# Patient Record
Sex: Female | Born: 1960 | Race: Black or African American | Hispanic: No | Marital: Married | Smoking: Former smoker
Health system: Southern US, Community
[De-identification: ages and names within clinical notes are randomized; demographics above are authoritative.]

## PROBLEM LIST (undated history)

## (undated) DIAGNOSIS — I10 Essential (primary) hypertension: Secondary | ICD-10-CM

## (undated) DIAGNOSIS — E119 Type 2 diabetes mellitus without complications: Secondary | ICD-10-CM

## (undated) HISTORY — PX: BREAST SURGERY: SHX581

## (undated) HISTORY — PX: WISDOM TOOTH EXTRACTION: SHX21

---

## 2004-10-12 ENCOUNTER — Other Ambulatory Visit: Payer: Self-pay

## 2004-10-12 ENCOUNTER — Emergency Department: Payer: Self-pay | Admitting: Emergency Medicine

## 2004-10-17 ENCOUNTER — Emergency Department: Payer: Self-pay | Admitting: Emergency Medicine

## 2004-10-19 ENCOUNTER — Emergency Department: Payer: Self-pay | Admitting: Emergency Medicine

## 2004-10-22 HISTORY — PX: BREAST EXCISIONAL BIOPSY: SUR124

## 2004-10-26 ENCOUNTER — Emergency Department: Payer: Self-pay | Admitting: Unknown Physician Specialty

## 2006-08-30 ENCOUNTER — Ambulatory Visit: Payer: Self-pay | Admitting: Family Medicine

## 2008-12-14 ENCOUNTER — Ambulatory Visit: Payer: Self-pay | Admitting: Internal Medicine

## 2009-09-15 ENCOUNTER — Emergency Department: Payer: Self-pay | Admitting: Emergency Medicine

## 2010-01-12 ENCOUNTER — Ambulatory Visit: Payer: Self-pay | Admitting: Internal Medicine

## 2011-01-25 ENCOUNTER — Ambulatory Visit: Payer: Self-pay | Admitting: Internal Medicine

## 2012-02-13 ENCOUNTER — Ambulatory Visit: Payer: Self-pay | Admitting: Internal Medicine

## 2012-03-04 ENCOUNTER — Ambulatory Visit: Payer: Self-pay | Admitting: Internal Medicine

## 2013-02-25 ENCOUNTER — Ambulatory Visit: Payer: Self-pay | Admitting: Internal Medicine

## 2013-04-14 ENCOUNTER — Ambulatory Visit: Payer: Self-pay | Admitting: Internal Medicine

## 2014-02-16 ENCOUNTER — Ambulatory Visit: Payer: Self-pay | Admitting: Internal Medicine

## 2014-05-17 ENCOUNTER — Ambulatory Visit: Payer: Self-pay | Admitting: Internal Medicine

## 2015-02-05 ENCOUNTER — Emergency Department: Admit: 2015-02-05 | Disposition: A | Discharge status: Home / Self Care | Payer: Self-pay | Admitting: Internal Medicine

## 2015-02-22 MED ORDER — FENTANYL CITRATE (PF) 100 MCG/2ML IJ SOLN
INTRAMUSCULAR | Status: AC
  Filled 2015-02-22: qty 2

## 2015-02-23 ENCOUNTER — Encounter: Payer: Self-pay | Admitting: *Deleted

## 2015-02-23 ENCOUNTER — Other Ambulatory Visit: Payer: Self-pay

## 2015-02-23 DIAGNOSIS — G5601 Carpal tunnel syndrome, right upper limb: Secondary | ICD-10-CM | POA: Diagnosis present

## 2015-02-23 DIAGNOSIS — E119 Type 2 diabetes mellitus without complications: Secondary | ICD-10-CM | POA: Diagnosis not present

## 2015-02-23 DIAGNOSIS — Z8249 Family history of ischemic heart disease and other diseases of the circulatory system: Secondary | ICD-10-CM | POA: Diagnosis not present

## 2015-02-23 DIAGNOSIS — Z823 Family history of stroke: Secondary | ICD-10-CM | POA: Diagnosis not present

## 2015-02-23 DIAGNOSIS — Z833 Family history of diabetes mellitus: Secondary | ICD-10-CM | POA: Diagnosis not present

## 2015-02-23 DIAGNOSIS — Z79899 Other long term (current) drug therapy: Secondary | ICD-10-CM | POA: Diagnosis not present

## 2015-02-23 DIAGNOSIS — F17210 Nicotine dependence, cigarettes, uncomplicated: Secondary | ICD-10-CM | POA: Diagnosis not present

## 2015-02-23 DIAGNOSIS — E785 Hyperlipidemia, unspecified: Secondary | ICD-10-CM | POA: Diagnosis not present

## 2015-02-24 ENCOUNTER — Other Ambulatory Visit: Payer: Self-pay

## 2015-02-24 ENCOUNTER — Encounter
Admission: RE | Admit: 2015-02-24 | Discharge: 2015-02-24 | Disposition: A | Discharge status: Home / Self Care | Payer: BLUE CROSS/BLUE SHIELD | Source: Ambulatory Visit | Attending: Anesthesiology | Admitting: Anesthesiology

## 2015-02-24 DIAGNOSIS — I10 Essential (primary) hypertension: Secondary | ICD-10-CM | POA: Insufficient documentation

## 2015-02-24 DIAGNOSIS — R9431 Abnormal electrocardiogram [ECG] [EKG]: Secondary | ICD-10-CM | POA: Diagnosis not present

## 2015-02-24 LAB — BASIC METABOLIC PANEL
ANION GAP: 7 (ref 5–15)
BUN: 14 mg/dL (ref 6–20)
CALCIUM: 9.7 mg/dL (ref 8.9–10.3)
CO2: 32 mmol/L (ref 22–32)
Chloride: 103 mmol/L (ref 101–111)
Creatinine, Ser: 0.71 mg/dL (ref 0.44–1.00)
GFR calc non Af Amer: >60 mL/min (ref >60)
Glucose, Bld: 117 mg/dL — ABNORMAL HIGH (ref 65–99)
Potassium: 4.2 mmol/L (ref 3.5–5.1)
Sodium: 142 mmol/L (ref 135–145)

## 2015-02-28 ENCOUNTER — Encounter: Payer: Self-pay | Admitting: *Deleted

## 2015-02-28 ENCOUNTER — Ambulatory Visit: Payer: BLUE CROSS/BLUE SHIELD | Admitting: Anesthesiology

## 2015-02-28 ENCOUNTER — Encounter
Admission: RE | Disposition: A | Discharge status: Home / Self Care | Payer: Self-pay | Source: Ambulatory Visit | Attending: Orthopedic Surgery

## 2015-02-28 ENCOUNTER — Ambulatory Visit
Admission: RE | Admit: 2015-02-28 | Discharge: 2015-02-28 | Disposition: A | Discharge status: Home / Self Care | Payer: BLUE CROSS/BLUE SHIELD | Source: Ambulatory Visit | Attending: Orthopedic Surgery | Admitting: Orthopedic Surgery

## 2015-02-28 DIAGNOSIS — E785 Hyperlipidemia, unspecified: Secondary | ICD-10-CM | POA: Insufficient documentation

## 2015-02-28 DIAGNOSIS — E119 Type 2 diabetes mellitus without complications: Secondary | ICD-10-CM | POA: Insufficient documentation

## 2015-02-28 DIAGNOSIS — G5601 Carpal tunnel syndrome, right upper limb: Secondary | ICD-10-CM | POA: Diagnosis not present

## 2015-02-28 DIAGNOSIS — Z833 Family history of diabetes mellitus: Secondary | ICD-10-CM | POA: Insufficient documentation

## 2015-02-28 DIAGNOSIS — F17210 Nicotine dependence, cigarettes, uncomplicated: Secondary | ICD-10-CM | POA: Insufficient documentation

## 2015-02-28 DIAGNOSIS — Z8249 Family history of ischemic heart disease and other diseases of the circulatory system: Secondary | ICD-10-CM | POA: Insufficient documentation

## 2015-02-28 DIAGNOSIS — Z79899 Other long term (current) drug therapy: Secondary | ICD-10-CM | POA: Insufficient documentation

## 2015-02-28 DIAGNOSIS — Z823 Family history of stroke: Secondary | ICD-10-CM | POA: Insufficient documentation

## 2015-02-28 HISTORY — DX: Type 2 diabetes mellitus without complications: E11.9

## 2015-02-28 HISTORY — DX: Essential (primary) hypertension: I10

## 2015-02-28 HISTORY — PX: CARPAL TUNNEL RELEASE: SHX101

## 2015-02-28 LAB — GLUCOSE, CAPILLARY: Glucose-Capillary: 106 mg/dL — ABNORMAL HIGH (ref 70–99)

## 2015-02-28 SURGERY — CARPAL TUNNEL RELEASE
Anesthesia: General | Laterality: Right | Wound class: Clean

## 2015-02-28 MED ORDER — BUPIVACAINE HCL (PF) 0.25 % IJ SOLN
INTRAMUSCULAR | Status: AC
  Filled 2015-02-28: qty 30

## 2015-02-28 MED ORDER — NEOMYCIN-POLYMYXIN B GU 40-200000 IR SOLN
Status: DC
Start: 2015-02-28 — End: 2015-02-28
  Filled 2015-02-28: qty 2

## 2015-02-28 MED ORDER — METOCLOPRAMIDE HCL 10 MG PO TABS: 5.0000 mg | ORAL_TABLET | Freq: Three times a day (TID) | ORAL | Status: DC | PRN

## 2015-02-28 MED ORDER — PROPOFOL 10 MG/ML IV BOLUS
INTRAVENOUS | Status: DC | PRN
  Administered 2015-02-28: 170 mg via INTRAVENOUS

## 2015-02-28 MED ORDER — FENTANYL CITRATE (PF) 100 MCG/2ML IJ SOLN: 25.0000 ug | INTRAMUSCULAR | Status: DC | PRN

## 2015-02-28 MED ORDER — BUPIVACAINE HCL (PF) 0.25 % IJ SOLN
INTRAMUSCULAR | Status: DC | PRN
  Administered 2015-02-28: 10 mL

## 2015-02-28 MED ORDER — MIDAZOLAM HCL 2 MG/2ML IJ SOLN
INTRAMUSCULAR | Status: DC | PRN
  Administered 2015-02-28: 2 mg via INTRAVENOUS

## 2015-02-28 MED ORDER — NEOMYCIN-POLYMYXIN B GU 40-200000 IR SOLN
Status: DC | PRN
  Administered 2015-02-28: 2 mL

## 2015-02-28 MED ORDER — ONDANSETRON HCL 4 MG/2ML IJ SOLN: 4.0000 mg | Freq: Once | INTRAMUSCULAR | Status: DC | PRN

## 2015-02-28 MED ORDER — ACETAMINOPHEN 10 MG/ML IV SOLN
INTRAVENOUS | Status: AC
  Filled 2015-02-28: qty 100

## 2015-02-28 MED ORDER — ONDANSETRON HCL 4 MG/2ML IJ SOLN: 4.0000 mg | Freq: Four times a day (QID) | INTRAMUSCULAR | Status: DC | PRN

## 2015-02-28 MED ORDER — GLYCOPYRROLATE 0.2 MG/ML IJ SOLN
INTRAMUSCULAR | Status: DC | PRN
  Administered 2015-02-28: 0.2 mg via INTRAVENOUS

## 2015-02-28 MED ORDER — FAMOTIDINE 20 MG PO TABS
20.0000 mg | ORAL_TABLET | Freq: Once | ORAL | Status: AC
  Administered 2015-02-28: 20 mg via ORAL

## 2015-02-28 MED ORDER — HYDROCODONE-ACETAMINOPHEN 5-325 MG PO TABS: 1.0000 | ORAL_TABLET | ORAL | Status: DC | PRN

## 2015-02-28 MED ORDER — CHLORHEXIDINE GLUCONATE 4 % EX LIQD: Freq: Once | CUTANEOUS | Status: DC

## 2015-02-28 MED ORDER — ONDANSETRON HCL 4 MG PO TABS: 4.0000 mg | ORAL_TABLET | Freq: Four times a day (QID) | ORAL | Status: DC | PRN

## 2015-02-28 MED ORDER — FENTANYL CITRATE (PF) 100 MCG/2ML IJ SOLN
INTRAMUSCULAR | Status: DC | PRN
  Administered 2015-02-28 (×3): 50 ug via INTRAVENOUS

## 2015-02-28 MED ORDER — SODIUM CHLORIDE 0.9 % IV SOLN
INTRAVENOUS | Status: DC
  Administered 2015-02-28 (×2): via INTRAVENOUS

## 2015-02-28 MED ORDER — METOCLOPRAMIDE HCL 5 MG/ML IJ SOLN: 5.0000 mg | Freq: Three times a day (TID) | INTRAMUSCULAR | Status: DC | PRN

## 2015-02-28 SURGICAL SUPPLY — 25 items
BANDAGE ELASTIC 3 CLIP NS LF (GAUZE/BANDAGES/DRESSINGS) ×3 IMPLANT
BLADE SURG 15 STRL LF DISP TIS (BLADE) ×1 IMPLANT
BLADE SURG 15 STRL SS (BLADE) ×2
BNDG ESMARK 4X12 TAN STRL LF (GAUZE/BANDAGES/DRESSINGS) ×3 IMPLANT
CANISTER SUCT 1200ML W/VALVE (MISCELLANEOUS) ×3 IMPLANT
CAST PADDING 3X4FT ST 30246 (SOFTGOODS) ×2
DRSG DERMACEA 8X12 NADH (GAUZE/BANDAGES/DRESSINGS) ×3 IMPLANT
DURAPREP 26ML APPLICATOR (WOUND CARE) ×3 IMPLANT
ELECT CAUTERY BLADE 6.4 (BLADE) ×3 IMPLANT
GAUZE SPONGE 4X4 12PLY STRL (GAUZE/BANDAGES/DRESSINGS) ×3 IMPLANT
GLOVE BIOGEL M STRL SZ7.5 (GLOVE) ×3 IMPLANT
GLOVE INDICATOR 8.0 STRL GRN (GLOVE) ×3 IMPLANT
GOWN STRL REUS W/ TWL LRG LVL3 (GOWN DISPOSABLE) ×1 IMPLANT
GOWN STRL REUS W/TWL LRG LVL3 (GOWN DISPOSABLE) ×2
GOWN STRL REUS W/TWL XL LVL4 (GOWN DISPOSABLE) ×3 IMPLANT
NS IRRIG 500ML POUR BTL (IV SOLUTION) ×3 IMPLANT
PACK EXTREMITY ARMC (MISCELLANEOUS) ×3 IMPLANT
PAD CAST CTTN 3X4 STRL (SOFTGOODS) ×1 IMPLANT
PAD GROUND ADULT SPLIT (MISCELLANEOUS) ×3 IMPLANT
SOL PREP PVP 2OZ (MISCELLANEOUS) ×3
SOLUTION PREP PVP 2OZ (MISCELLANEOUS) ×1 IMPLANT
SPLINT CAST 1 STEP 3X12 (MISCELLANEOUS) ×3 IMPLANT
STOCKINETTE STRL 4IN 9604848 (GAUZE/BANDAGES/DRESSINGS) ×3 IMPLANT
STRAP SAFETY BODY (MISCELLANEOUS) ×3 IMPLANT
SUT ETHILON 5-0 FS-2 18 BLK (SUTURE) ×3 IMPLANT

## 2015-02-28 NOTE — Anesthesia Postprocedure Evaluation (Signed)
  Anesthesia Post-op Note  Patient: Karen Pitts  Procedure(s) Performed: Procedure(s): CARPAL TUNNEL RELEASE (Right)  Anesthesia type:General  Patient location: PACU  Post pain: Pain level controlled  Post assessment: Post-op Vital signs reviewed, Patient's Cardiovascular Status Stable, Respiratory Function Stable, Patent Airway and No signs of Nausea or vomiting  Post vital signs: Reviewed and stable  Last Vitals:  Filed Vitals:   02/28/15 1720  BP: 147/83  Pulse: 72  Temp:   Resp: 14    Level of consciousness: awake, alert  and patient cooperative  Complications: No apparent anesthesia complications

## 2015-02-28 NOTE — Brief Op Note (Signed)
02/28/2015  4:39 PM  PATIENT:  Karen Pitts  54 y.o. female  PRE-OPERATIVE DIAGNOSIS:  RIGHT CARPAL TUNNEL SYNDROME  POST-OPERATIVE DIAGNOSIS:  RIGHT CARPAL TUNNEL SYNDROME  PROCEDURE:  Procedure(s): CARPAL TUNNEL RELEASE (Right)  SURGEON:  Surgeon(s) and Role:    * Donato HeinzJames P Hooten, MD - Primary  PHYSICIAN ASSISTANT:   ASSISTANTS: none   ANESTHESIA:   general  EBL:  Total I/O In: 400 [I.V.:400] Out: -   BLOOD ADMINISTERED:none  DRAINS: none   LOCAL MEDICATIONS USED:  MARCAINE    and Amount: 10 ml  SPECIMEN:  No Specimen  DISPOSITION OF SPECIMEN:  N/A  COUNTS:  YES  TOURNIQUET:  27 min  DICTATION: .Dragon Dictation  PLAN OF CARE: Discharge to home after PACU  PATIENT DISPOSITION:  PACU - hemodynamically stable.   Delay start of Pharmacological VTE agent (>24hrs) due to surgical blood loss or risk of bleeding: not applicable

## 2015-02-28 NOTE — Op Note (Signed)
DATE OF SURGERY:  02/28/2015  PATIENT NAME:  Karen SorrowLazette Minatee   DOB: 05/02/1961  MRN: [id number]  PRE-OPERATIVE DIAGNOSIS: Right carpal tunnel syndrome  POST-OPERATIVE DIAGNOSIS:  Same  PROCEDURE:  Right carpal tunnel release  SURGEON:  Jena GaussJames P Amadi Yoshino, Jr. M.D.  ANESTHESIA: General  ESTIMATED BLOOD LOSS: Minimal  FLUIDS REPLACED: 700 mL of crystalloid  TOURNIQUET TIME: 27 minutes  DRAINS: None  INDICATIONS FOR SURGERY: Karen SorrowLazette Minatee is a 54 y.o. year old female with a long history of numbness and paresthesias to the right hand. EMG/nerve conduction studies demonstrated findings consistent with carpal tunnel syndrome.The patient had not seen any significant improvement despite conservative nonsurgical intervention. After discussion of the risks and benefits of surgical intervention, the patient expressed understanding of the risks benefits and agree with plans for carpal tunnel release.   PROCEDURE IN DETAIL: The patient was brought into the operating room and after adequate general anesthesia, a tourniquet was placed on the patient's right upper arm.The right hand and arm were prepped with alcohol and Duraprep and draped in the usual sterile fashion. A "time-out" was performed as per usual protocol. The hand and forearm were exsanguinated using an Esmarch and the tourniquet was inflated to 250 mmHg. Loupe magnification was used throughout the procedure. An incision was made just ulnar to the thenar palmar crease. Dissection was carried down through the palmar fascia to the transverse carpal ligament. The transverse carpal ligament was sharply incised, taking care to protect the underlying structures with the carpal tunnel. Complete release of the transverse carpal ligament was achieved. There was no evidence of ganglion cyst or lipoma within the carpal tunnel. The wound was irrigated with copious amounts of normal saline with antibiotic solution. The skin was then re-approximated with  interrupted sutures of #5-0 nylon. A sterile dressing was applied followed by application of a volar splint. The tourniquet was deflated with a total tourniquet time of 27 minutes.  The patient tolerated the procedure well and was transported to the PACU in stable condition.  Meg Niemeier P. Angie FavaHooten, Jr., M.D.

## 2015-02-28 NOTE — Transfer of Care (Signed)
Immediate Anesthesia Transfer of Care Note  Patient: Karen Pitts  Procedure(s) Performed: Procedure(s): CARPAL TUNNEL RELEASE (Right)  Patient Location: PACU  Anesthesia Type:General  Level of Consciousness: awake, alert  and oriented  Airway & Oxygen Therapy: Patient Spontanous Breathing and Patient connected to face mask oxygen  Post-op Assessment: Report given to RN and Post -op Vital signs reviewed and stable  Post vital signs: Reviewed and stable  Last Vitals:  Filed Vitals:   02/28/15 1415  BP: 143/76  Pulse: 70  Temp: 36.6 C  Resp: 20    Complications: No apparent anesthesia complications

## 2015-02-28 NOTE — Anesthesia Procedure Notes (Signed)
Procedure Name: LMA Insertion Date/Time: 02/28/2015 3:49 PM Performed by: Omer JackWEATHERLY, Reace Breshears Pre-anesthesia Checklist: Patient identified, Patient being monitored, Timeout performed, Emergency Drugs available and Suction available Patient Re-evaluated:Patient Re-evaluated prior to inductionOxygen Delivery Method: Circle system utilized Preoxygenation: Pre-oxygenation with 100% oxygen Intubation Type: IV induction Ventilation: Mask ventilation without difficulty LMA: LMA inserted LMA Size: 3.5 Tube type: Oral Number of attempts: 1 Placement Confirmation: positive ETCO2 and breath sounds checked- equal and bilateral Tube secured with: Tape Dental Injury: Teeth and Oropharynx as per pre-operative assessment

## 2015-02-28 NOTE — Discharge Instructions (Signed)
Follow Dr. Elenor LegatoHooten's postop discharge instruction sheet as reviewed.  Follow up appts   ----     Wed 03/09/15 at 3 pm [facility]                                        Tues 04/12/15 at 145pm with Dr Ernest PineHooten  Script given for pain med Norco - take as prescribed

## 2015-02-28 NOTE — H&P (Signed)
The patient has been re-examined, and the chart reviewed, and there have been no interval changes to the documented history and physical.    The risks, benefits, and alternatives have been discussed at length, and the patient is willing to proceed.   

## 2015-02-28 NOTE — Anesthesia Preprocedure Evaluation (Addendum)
Anesthesia Evaluation  Patient identified by MRN, date of birth, ID band Patient awake    Reviewed: Allergy & Precautions, H&P , NPO status , Patient's Chart, lab work & pertinent test results, reviewed documented beta blocker date and time   Airway Mallampati: III  TM Distance: >3 FB Neck ROM: full    Dental  (+) Chipped, Missing   Pulmonary Current Smoker,          Cardiovascular hypertension,     Neuro/Psych    GI/Hepatic   Endo/Other  diabetes  Renal/GU      Musculoskeletal   Abdominal   Peds  Hematology   Anesthesia Other Findings   Reproductive/Obstetrics                            Anesthesia Physical Anesthesia Plan  ASA: III  Anesthesia Plan: General   Post-op Pain Management:    Induction: Intravenous  Airway Management Planned: LMA  Additional Equipment:   Intra-op Plan:   Post-operative Plan:   Informed Consent: I have reviewed the patients History and Physical, chart, labs and discussed the procedure including the risks, benefits and alternatives for the proposed anesthesia with the patient or authorized representative who has indicated his/her understanding and acceptance.     Plan Discussed with: CRNA  Anesthesia Plan Comments:         Anesthesia Quick Evaluation

## 2015-03-03 ENCOUNTER — Encounter: Payer: Self-pay | Admitting: Orthopedic Surgery

## 2015-03-30 ENCOUNTER — Inpatient Hospital Stay: Admission: RE | Admit: 2015-03-30 | Payer: BLUE CROSS/BLUE SHIELD | Source: Ambulatory Visit

## 2015-03-30 ENCOUNTER — Encounter: Payer: Self-pay | Admitting: *Deleted

## 2015-03-30 DIAGNOSIS — G5602 Carpal tunnel syndrome, left upper limb: Secondary | ICD-10-CM | POA: Diagnosis not present

## 2015-03-30 DIAGNOSIS — F17210 Nicotine dependence, cigarettes, uncomplicated: Secondary | ICD-10-CM | POA: Diagnosis not present

## 2015-03-30 DIAGNOSIS — I10 Essential (primary) hypertension: Secondary | ICD-10-CM | POA: Diagnosis not present

## 2015-03-30 DIAGNOSIS — E119 Type 2 diabetes mellitus without complications: Secondary | ICD-10-CM | POA: Diagnosis not present

## 2015-03-30 DIAGNOSIS — Z7982 Long term (current) use of aspirin: Secondary | ICD-10-CM | POA: Diagnosis not present

## 2015-03-30 DIAGNOSIS — E785 Hyperlipidemia, unspecified: Secondary | ICD-10-CM | POA: Diagnosis not present

## 2015-03-30 NOTE — Patient Instructions (Signed)
  Your procedure is scheduled on: 04-01-15 Report to [facility] 2ND FLOOR. To find out your arrival time please call [phone] between 1PM - 3PM on 03-31-15  Remember: Instructions that are not followed completely may result in serious medical risk, up to and including death, or upon the discretion of your surgeon and anesthesiologist your surgery may need to be rescheduled.    _X___ 1. Do not eat food or drink liquids after midnight. No gum chewing or hard candies.     _X___ 2. No Alcohol for 24 hours before or after surgery.   ____ 3. Bring all medications with you on the day of surgery if instructed.    ____ 4. Notify your doctor if there is any change in your medical condition     (cold, fever, infections).     Do not wear jewelry, make-up, hairpins, clips or nail polish.  Do not wear lotions, powders, or perfumes. You may wear deodorant.  Do not shave 48 hours prior to surgery. Men may shave face and neck.  Do not bring valuables to the hospital.    [facility] belongings or valuables.               Contacts, dentures or bridgework may not be worn into surgery.  Leave your suitcase in the car. After surgery it may be brought to your room.  For patients admitted to the hospital, discharge time is determined by your  treatment team.   Patients discharged the day of surgery will not be allowed to drive home.   Please read over the following fact sheets that you were given:     _X___ Take these medicines the morning of surgery with A SIP OF WATER:    1.GABAPENTIN  2. LOSARTAN  3. SIMVASTATIN  4.  5.  6.  ____ Fleet Enema (as directed)   ____ Use CHG Soap as directed  ____ Use inhalers on the day of surgery  ____ Stop metformin 2 days prior to surgery    ____ Take 1/2 of usual insulin dose the night before surgery and none on the morning of surgery.   __X__ Stop Coumadin/Plavix/aspirin-STOP ASA NOW  ____ Stop  Anti-inflammatories-NO NSAIDS OR ASA PRODUCTS-TRAMADOL OK TO CONTINUE   ____ Stop supplements until after surgery.    ____ Bring C-Pap to the hospital.

## 2015-03-31 DIAGNOSIS — Z9889 Other specified postprocedural states: Secondary | ICD-10-CM | POA: Insufficient documentation

## 2015-04-01 ENCOUNTER — Encounter: Payer: Self-pay | Admitting: *Deleted

## 2015-04-01 ENCOUNTER — Encounter
Admission: RE | Disposition: A | Discharge status: Home / Self Care | Payer: Self-pay | Source: Ambulatory Visit | Attending: Orthopedic Surgery

## 2015-04-01 ENCOUNTER — Ambulatory Visit: Payer: BLUE CROSS/BLUE SHIELD | Admitting: Certified Registered Nurse Anesthetist

## 2015-04-01 ENCOUNTER — Ambulatory Visit
Admission: RE | Admit: 2015-04-01 | Discharge: 2015-04-01 | Disposition: A | Discharge status: Home / Self Care | Payer: BLUE CROSS/BLUE SHIELD | Source: Ambulatory Visit | Attending: Orthopedic Surgery | Admitting: Orthopedic Surgery

## 2015-04-01 DIAGNOSIS — G5602 Carpal tunnel syndrome, left upper limb: Secondary | ICD-10-CM | POA: Diagnosis not present

## 2015-04-01 DIAGNOSIS — E119 Type 2 diabetes mellitus without complications: Secondary | ICD-10-CM | POA: Insufficient documentation

## 2015-04-01 DIAGNOSIS — I10 Essential (primary) hypertension: Secondary | ICD-10-CM | POA: Insufficient documentation

## 2015-04-01 DIAGNOSIS — E785 Hyperlipidemia, unspecified: Secondary | ICD-10-CM | POA: Insufficient documentation

## 2015-04-01 DIAGNOSIS — Z7982 Long term (current) use of aspirin: Secondary | ICD-10-CM | POA: Insufficient documentation

## 2015-04-01 DIAGNOSIS — F17210 Nicotine dependence, cigarettes, uncomplicated: Secondary | ICD-10-CM | POA: Insufficient documentation

## 2015-04-01 HISTORY — PX: CARPAL TUNNEL RELEASE: SHX101

## 2015-04-01 LAB — GLUCOSE, CAPILLARY
GLUCOSE-CAPILLARY: 120 mg/dL — AB (ref 65–99)
GLUCOSE-CAPILLARY: 146 mg/dL — AB (ref 65–99)

## 2015-04-01 SURGERY — CARPAL TUNNEL RELEASE
Anesthesia: General | Laterality: Left

## 2015-04-01 MED ORDER — ONDANSETRON HCL 4 MG/2ML IJ SOLN
INTRAMUSCULAR | Status: DC | PRN
  Administered 2015-04-01: 4 mg via INTRAVENOUS

## 2015-04-01 MED ORDER — ESMOLOL HCL 10 MG/ML IV SOLN
INTRAVENOUS | Status: DC | PRN
  Administered 2015-04-01: 30 mg via INTRAVENOUS

## 2015-04-01 MED ORDER — SUCCINYLCHOLINE CHLORIDE 20 MG/ML IJ SOLN
INTRAMUSCULAR | Status: DC | PRN
  Administered 2015-04-01: 10 mg via INTRAVENOUS
  Administered 2015-04-01: 80 mg via INTRAVENOUS

## 2015-04-01 MED ORDER — ALBUTEROL SULFATE HFA 108 (90 BASE) MCG/ACT IN AERS
INHALATION_SPRAY | RESPIRATORY_TRACT | Status: DC | PRN
  Administered 2015-04-01 (×2): 6 via RESPIRATORY_TRACT

## 2015-04-01 MED ORDER — MIDAZOLAM HCL 2 MG/2ML IJ SOLN
INTRAMUSCULAR | Status: DC | PRN
  Administered 2015-04-01: 2 mg via INTRAVENOUS

## 2015-04-01 MED ORDER — DEXMEDETOMIDINE HCL 200 MCG/2ML IV SOLN
INTRAVENOUS | Status: DC | PRN
  Administered 2015-04-01: 20 ug via INTRAVENOUS
  Administered 2015-04-01: 10 ug via INTRAVENOUS

## 2015-04-01 MED ORDER — LIDOCAINE HCL (CARDIAC) 20 MG/ML IV SOLN
INTRAVENOUS | Status: DC | PRN
  Administered 2015-04-01 (×2): 80 mg via INTRAVENOUS

## 2015-04-01 MED ORDER — FENTANYL CITRATE (PF) 100 MCG/2ML IJ SOLN: 25.0000 ug | INTRAMUSCULAR | Status: DC | PRN

## 2015-04-01 MED ORDER — FAMOTIDINE 20 MG PO TABS
ORAL_TABLET | ORAL | Status: AC
  Administered 2015-04-01: 20 mg via ORAL
  Filled 2015-04-01: qty 1

## 2015-04-01 MED ORDER — NEOMYCIN-POLYMYXIN B GU 40-200000 IR SOLN
Status: DC | PRN
  Administered 2015-04-01: 2 mL

## 2015-04-01 MED ORDER — CHLORHEXIDINE GLUCONATE 4 % EX LIQD: Freq: Once | CUTANEOUS | Status: DC

## 2015-04-01 MED ORDER — NEOMYCIN-POLYMYXIN B GU 40-200000 IR SOLN
Status: AC
  Filled 2015-04-01: qty 2

## 2015-04-01 MED ORDER — FAMOTIDINE 20 MG PO TABS
20.0000 mg | ORAL_TABLET | Freq: Once | ORAL | Status: AC
  Administered 2015-04-01: 20 mg via ORAL

## 2015-04-01 MED ORDER — ACETAMINOPHEN 10 MG/ML IV SOLN
INTRAVENOUS | Status: DC | PRN
  Administered 2015-04-01: 1000 mg via INTRAVENOUS

## 2015-04-01 MED ORDER — GLYCOPYRROLATE 0.2 MG/ML IJ SOLN
INTRAMUSCULAR | Status: DC | PRN
  Administered 2015-04-01: 0.2 mg via INTRAVENOUS

## 2015-04-01 MED ORDER — BUPIVACAINE HCL (PF) 0.25 % IJ SOLN
INTRAMUSCULAR | Status: DC | PRN
  Administered 2015-04-01: 10 mL

## 2015-04-01 MED ORDER — PHENYLEPHRINE HCL 10 MG/ML IJ SOLN
INTRAMUSCULAR | Status: DC | PRN
  Administered 2015-04-01 (×2): 100 ug via INTRAVENOUS
  Administered 2015-04-01: 200 ug via INTRAVENOUS

## 2015-04-01 MED ORDER — PROPOFOL 10 MG/ML IV BOLUS
INTRAVENOUS | Status: DC | PRN
  Administered 2015-04-01: 80 mg via INTRAVENOUS
  Administered 2015-04-01: 150 mg via INTRAVENOUS
  Administered 2015-04-01: 120 mg via INTRAVENOUS
  Administered 2015-04-01: 50 mg via INTRAVENOUS
  Administered 2015-04-01: 100 mg via INTRAVENOUS

## 2015-04-01 MED ORDER — ONDANSETRON HCL 4 MG/2ML IJ SOLN: 4.0000 mg | Freq: Once | INTRAMUSCULAR | Status: DC | PRN

## 2015-04-01 MED ORDER — FENTANYL CITRATE (PF) 100 MCG/2ML IJ SOLN
INTRAMUSCULAR | Status: DC | PRN
  Administered 2015-04-01: 100 ug via INTRAVENOUS

## 2015-04-01 MED ORDER — ACETAMINOPHEN 10 MG/ML IV SOLN
INTRAVENOUS | Status: AC
  Filled 2015-04-01: qty 100

## 2015-04-01 MED ORDER — SODIUM CHLORIDE 0.9 % IV SOLN
INTRAVENOUS | Status: DC
  Administered 2015-04-01 (×2): via INTRAVENOUS

## 2015-04-01 MED ORDER — BUPIVACAINE HCL (PF) 0.25 % IJ SOLN
INTRAMUSCULAR | Status: AC
  Filled 2015-04-01: qty 30

## 2015-04-01 MED ORDER — HYDROCODONE-ACETAMINOPHEN 5-325 MG PO TABS: 1.0000 | ORAL_TABLET | ORAL | Status: DC | PRN

## 2015-04-01 SURGICAL SUPPLY — 25 items
BANDAGE ELASTIC 3 CLIP NS LF (GAUZE/BANDAGES/DRESSINGS) ×3 IMPLANT
BLADE SURG 15 STRL LF DISP TIS (BLADE) ×1 IMPLANT
BLADE SURG 15 STRL SS (BLADE) ×2
BNDG ESMARK 4X12 TAN STRL LF (GAUZE/BANDAGES/DRESSINGS) ×3 IMPLANT
CANISTER SUCT 1200ML W/VALVE (MISCELLANEOUS) ×3 IMPLANT
CAST PADDING 3X4FT ST 30246 (SOFTGOODS) ×2
DRSG DERMACEA 8X12 NADH (GAUZE/BANDAGES/DRESSINGS) ×3 IMPLANT
DURAPREP 26ML APPLICATOR (WOUND CARE) ×3 IMPLANT
ELECT CAUTERY BLADE 6.4 (BLADE) ×3 IMPLANT
GAUZE SPONGE 4X4 12PLY STRL (GAUZE/BANDAGES/DRESSINGS) ×3 IMPLANT
GLOVE BIOGEL M STRL SZ7.5 (GLOVE) ×3 IMPLANT
GLOVE INDICATOR 8.0 STRL GRN (GLOVE) ×3 IMPLANT
GOWN STRL REUS W/ TWL LRG LVL3 (GOWN DISPOSABLE) ×1 IMPLANT
GOWN STRL REUS W/TWL LRG LVL3 (GOWN DISPOSABLE) ×2
GOWN STRL REUS W/TWL XL LVL4 (GOWN DISPOSABLE) ×3 IMPLANT
NS IRRIG 500ML POUR BTL (IV SOLUTION) ×3 IMPLANT
PACK EXTREMITY ARMC (MISCELLANEOUS) ×3 IMPLANT
PAD CAST CTTN 3X4 STRL (SOFTGOODS) ×1 IMPLANT
PAD GROUND ADULT SPLIT (MISCELLANEOUS) ×3 IMPLANT
SOL PREP PVP 2OZ (MISCELLANEOUS) ×3
SOLUTION PREP PVP 2OZ (MISCELLANEOUS) ×1 IMPLANT
SPLINT CAST 1 STEP 3X12 (MISCELLANEOUS) ×3 IMPLANT
STOCKINETTE STRL 4IN 9604848 (GAUZE/BANDAGES/DRESSINGS) ×3 IMPLANT
STRAP SAFETY BODY (MISCELLANEOUS) ×3 IMPLANT
SUT ETHILON 5-0 FS-2 18 BLK (SUTURE) ×3 IMPLANT

## 2015-04-01 NOTE — Op Note (Signed)
DATE OF SURGERY:  04/01/2015  PATIENT NAME:  Karen Pitts   DOB: 07/16/1961  MRN: [id number]  PRE-OPERATIVE DIAGNOSIS: Left carpal tunnel syndrome  POST-OPERATIVE DIAGNOSIS:  Same  PROCEDURE:  Left carpal tunnel release  SURGEON:  Jena Gauss. M.D.  ANESTHESIA: general  ESTIMATED BLOOD LOSS: Minimal  FLUIDS REPLACED: 1100 mL of crystalloid  TOURNIQUET TIME: 29 minutes  DRAINS: None  INDICATIONS FOR SURGERY: [name] is a 54 y.o. year old female with a long history of numbness and paresthesias to the left hand. EMG/nerve conduction studies demonstrated findings consistent with carpal tunnel syndrome.The patient had not seen any significant improvement despite conservative nonsurgical intervention. After discussion of the risks and benefits of surgical intervention, the patient expressed understanding of the risks benefits and agree with plans for carpal tunnel release.   PROCEDURE IN DETAIL: The patient was brought into the operating room and after adequate general anesthesia, a tourniquet was placed on the patient's left upper arm.The left hand and arm were prepped with alcohol and Duraprep and draped in the usual sterile fashion. A "time-out" was performed as per usual protocol. The hand and forearm were exsanguinated using an Esmarch and the tourniquet was inflated to 250 mmHg. Loupe magnification was used throughout the procedure. An incision was made just ulnar to the thenar palmar crease. Dissection was carried down through the palmar fascia to the transverse carpal ligament. The transverse carpal ligament was sharply incised, taking care to protect the underlying structures with the carpal tunnel. Complete release of the transverse carpal ligament was achieved. There was no evidence of ganglion cyst or lipoma within the carpal tunnel. The wound was irrigated with copious amounts of normal saline with antibiotic solution. The skin was then re-approximated with interrupted  sutures of #5-0 nylon. A sterile dressing was applied followed by application of a volar splint. The tourniquet was deflated with a total tourniquet time of 29 minutes.  The patient tolerated the procedure well and was transported to the PACU in stable condition.  James P. Angie Fava., M.D.

## 2015-04-01 NOTE — Anesthesia Postprocedure Evaluation (Signed)
  Anesthesia Post-op Note  Patient: Karen Pitts  Procedure(s) Performed: Procedure(s): CARPAL TUNNEL RELEASE (Left)  Anesthesia type:General  Patient location: PACU  Post pain: Pain level controlled  Post assessment: Post-op Vital signs reviewed, Patient's Cardiovascular Status Stable, Respiratory Function Stable, Patent Airway and No signs of Nausea or vomiting  Post vital signs: Reviewed and stable  Last Vitals:  Filed Vitals:   04/01/15 1205  BP: 132/73  Pulse: 86  Temp:   Resp: 17    Level of consciousness: awake, alert  and patient cooperative  Complications: No apparent anesthesia complications

## 2015-04-01 NOTE — Brief Op Note (Signed)
04/01/2015  11:52 AM  PATIENT:  Karen Pitts  54 y.o. female  PRE-OPERATIVE DIAGNOSIS:  LEFT CARPAL TUNNEL syndrome  POST-OPERATIVE DIAGNOSIS:  same   PROCEDURE:  Procedure(s): CARPAL TUNNEL RELEASE (Left)  SURGEON:  Surgeon(s) and Role:    * Donato Heinz, MD - Primary  ASSISTANTS: none   ANESTHESIA:   general  EBL:  Total I/O In: 1100 [I.V.:1100] Out: - minimal  BLOOD ADMINISTERED:none  DRAINS: none   LOCAL MEDICATIONS USED:  MARCAINE     SPECIMEN:  No Specimen  DISPOSITION OF SPECIMEN:  N/A  COUNTS:  YES  TOURNIQUET:  29 min   DICTATION: .Dragon Dictation  PLAN OF CARE: Discharge to home after PACU  PATIENT DISPOSITION:  PACU - hemodynamically stable.   Delay start of Pharmacological VTE agent (>24hrs) due to surgical blood loss or risk of bleeding: not applicable

## 2015-04-01 NOTE — Transfer of Care (Signed)
Immediate Anesthesia Transfer of Care Note  Patient: Karen Pitts  Procedure(s) Performed: Procedure(s): CARPAL TUNNEL RELEASE (Left)  Patient Location: PACU  Anesthesia Type:General  Level of Consciousness: awake and alert   Airway & Oxygen Therapy: Patient Spontanous Breathing and Patient connected to face mask oxygen  Post-op Assessment: Report given to RN and Post -op Vital signs reviewed and stable  Post vital signs: Reviewed and stable  Last Vitals:  Filed Vitals:   04/01/15 1152  BP: 113/70  Pulse: 95  Temp: 36.3 C  Resp: 15    Complications: No apparent anesthesia complications

## 2015-04-01 NOTE — H&P (Signed)
The patient has been re-examined, and the chart reviewed, and there have been no interval changes to the documented history and physical.    The risks, benefits, and alternatives have been discussed at length, and the patient is willing to proceed.   

## 2015-04-01 NOTE — Anesthesia Preprocedure Evaluation (Signed)
Anesthesia Evaluation  Patient identified by MRN, date of birth, ID band Patient awake    Reviewed: Allergy & Precautions, NPO status , Patient's Chart, lab work & pertinent test results  History of Anesthesia Complications Negative for: history of anesthetic complications  Airway Mallampati: II  TM Distance: >3 FB Neck ROM: Full    Dental  (+)    Pulmonary neg pulmonary ROS, Current Smoker,  Smokes 3cig/day breath sounds clear to auscultation  Pulmonary exam normal       Cardiovascular Exercise Tolerance: Good hypertension, Pt. on medications Normal cardiovascular examRhythm:Regular Rate:Normal     Neuro/Psych negative neurological ROS  negative psych ROS   GI/Hepatic negative GI ROS, Neg liver ROS,   Endo/Other  diabetes, Well Controlled, Type 2  Renal/GU negative Renal ROS  negative genitourinary   Musculoskeletal negative musculoskeletal ROS (+)   Abdominal   Peds negative pediatric ROS (+)  Hematology negative hematology ROS (+)   Anesthesia Other Findings   Reproductive/Obstetrics negative OB ROS                             Anesthesia Physical Anesthesia Plan  ASA: III  Anesthesia Plan: General   Post-op Pain Management:    Induction: Intravenous  Airway Management Planned: LMA  Additional Equipment:   Intra-op Plan:   Post-operative Plan: Extubation in OR  Informed Consent: I have reviewed the patients History and Physical, chart, labs and discussed the procedure including the risks, benefits and alternatives for the proposed anesthesia with the patient or authorized representative who has indicated his/her understanding and acceptance.   Dental advisory given  Plan Discussed with: CRNA and Surgeon  Anesthesia Plan Comments:         Anesthesia Quick Evaluation

## 2015-05-27 ENCOUNTER — Other Ambulatory Visit: Payer: Self-pay | Admitting: Internal Medicine

## 2015-05-27 DIAGNOSIS — Z1231 Encounter for screening mammogram for malignant neoplasm of breast: Secondary | ICD-10-CM

## 2015-06-06 ENCOUNTER — Ambulatory Visit: Payer: BLUE CROSS/BLUE SHIELD

## 2015-06-13 ENCOUNTER — Ambulatory Visit
Admission: RE | Admit: 2015-06-13 | Discharge: 2015-06-13 | Disposition: A | Discharge status: Home / Self Care | Payer: BLUE CROSS/BLUE SHIELD | Source: Ambulatory Visit | Attending: Internal Medicine | Admitting: Internal Medicine

## 2015-06-13 DIAGNOSIS — Z1231 Encounter for screening mammogram for malignant neoplasm of breast: Secondary | ICD-10-CM

## 2015-11-13 ENCOUNTER — Encounter: Payer: Self-pay | Admitting: Emergency Medicine

## 2015-11-13 ENCOUNTER — Emergency Department: Payer: BLUE CROSS/BLUE SHIELD

## 2015-11-13 ENCOUNTER — Emergency Department
Admission: EM | Admit: 2015-11-13 | Discharge: 2015-11-13 | Disposition: A | Discharge status: Home / Self Care | Payer: BLUE CROSS/BLUE SHIELD | Attending: Emergency Medicine | Admitting: Emergency Medicine

## 2015-11-13 DIAGNOSIS — W1839XA Other fall on same level, initial encounter: Secondary | ICD-10-CM | POA: Diagnosis not present

## 2015-11-13 DIAGNOSIS — F17210 Nicotine dependence, cigarettes, uncomplicated: Secondary | ICD-10-CM | POA: Insufficient documentation

## 2015-11-13 DIAGNOSIS — I10 Essential (primary) hypertension: Secondary | ICD-10-CM | POA: Diagnosis not present

## 2015-11-13 DIAGNOSIS — Y9289 Other specified places as the place of occurrence of the external cause: Secondary | ICD-10-CM | POA: Insufficient documentation

## 2015-11-13 DIAGNOSIS — Y998 Other external cause status: Secondary | ICD-10-CM | POA: Insufficient documentation

## 2015-11-13 DIAGNOSIS — S300XXA Contusion of lower back and pelvis, initial encounter: Secondary | ICD-10-CM | POA: Insufficient documentation

## 2015-11-13 DIAGNOSIS — E119 Type 2 diabetes mellitus without complications: Secondary | ICD-10-CM | POA: Diagnosis not present

## 2015-11-13 DIAGNOSIS — Y9389 Activity, other specified: Secondary | ICD-10-CM | POA: Insufficient documentation

## 2015-11-13 DIAGNOSIS — Z7982 Long term (current) use of aspirin: Secondary | ICD-10-CM | POA: Diagnosis not present

## 2015-11-13 DIAGNOSIS — Z79899 Other long term (current) drug therapy: Secondary | ICD-10-CM | POA: Diagnosis not present

## 2015-11-13 DIAGNOSIS — S3992XA Unspecified injury of lower back, initial encounter: Secondary | ICD-10-CM | POA: Diagnosis present

## 2015-11-13 MED ORDER — IBUPROFEN 800 MG PO TABS: 800.0000 mg | ORAL_TABLET | Freq: Three times a day (TID) | ORAL | Status: DC | PRN

## 2015-11-13 MED ORDER — CYCLOBENZAPRINE HCL 10 MG PO TABS
10.0000 mg | ORAL_TABLET | Freq: Once | ORAL | Status: AC
  Administered 2015-11-13: 10 mg via ORAL
  Filled 2015-11-13: qty 1

## 2015-11-13 MED ORDER — HYDROCODONE-ACETAMINOPHEN 5-325 MG PO TABS: 1.0000 | ORAL_TABLET | ORAL | Status: DC | PRN

## 2015-11-13 MED ORDER — HYDROCODONE-ACETAMINOPHEN 5-325 MG PO TABS
2.0000 | ORAL_TABLET | Freq: Once | ORAL | Status: AC
  Administered 2015-11-13: 2 via ORAL
  Filled 2015-11-13: qty 2

## 2015-11-13 MED ORDER — BACLOFEN 10 MG PO TABS: 10.0000 mg | ORAL_TABLET | Freq: Three times a day (TID) | ORAL | Status: DC

## 2015-11-13 NOTE — ED Notes (Signed)
Fell last night and has back, buttocks, neck pain. Able to move all extremities

## 2015-11-13 NOTE — ED Provider Notes (Signed)
[facility] Emergency Department Provider Note  ____________________________________________  Time seen: Approximately 4:14 PM  I have reviewed the triage vital signs and the nursing notes.   HISTORY  Chief Complaint Fall   HPI Karen Pitts is a 55 y.o. female presents for evaluation of falling last night. Complains of pain to her buttocks and tailbone area. Patient states able to move all extremities denies any numbness/tingling.   Past Medical History  Diagnosis Date  . Hypertension   . Diabetes mellitus without complication (HCC)     no meds currently    There are no active problems to display for this patient.   Past Surgical History  Procedure Laterality Date  . Breast surgery      right breast  . Wisdom tooth extraction    . Carpal tunnel release Right 02/28/2015    Procedure: CARPAL TUNNEL RELEASE;  Surgeon: Donato Heinz, MD;  Location: [facility] ORS;  Service: Orthopedics;  Laterality: Right;  . Carpal tunnel release Left 04/01/2015    Procedure: CARPAL TUNNEL RELEASE;  Surgeon: Donato Heinz, MD;  Location: [facility] ORS;  Service: Orthopedics;  Laterality: Left;  . Breast biopsy Right 2006    EXCISIONAL - NEG    Current Outpatient Rx  Name  Route  Sig  Dispense  Refill  . aspirin 81 MG tablet   Oral   Take 81 mg by mouth daily.         . baclofen (LIORESAL) 10 MG tablet   Oral   Take 1 tablet (10 mg total) by mouth 3 (three) times daily.   30 tablet   0   . gabapentin (NEURONTIN) 400 MG capsule   Oral   Take 400 mg by mouth 3 (three) times daily.          . hydrochlorothiazide (HYDRODIURIL) 25 MG tablet   Oral   Take 25 mg by mouth daily.         Marland Kitchen HYDROcodone-acetaminophen (NORCO) 5-325 MG tablet   Oral   Take 1-2 tablets by mouth every 4 (four) hours as needed for moderate pain.   8 tablet   0   . ibuprofen (ADVIL,MOTRIN) 800 MG tablet   Oral   Take 1 tablet (800 mg total) by mouth every 8 (eight) hours as  needed.   30 tablet   0   . losartan (COZAAR) 100 MG tablet   Oral   Take 100 mg by mouth every morning.         . Multiple Vitamin (MULTIVITAMIN) capsule   Oral   Take 1 capsule by mouth daily.         . simvastatin (ZOCOR) 20 MG tablet   Oral   Take 20 mg by mouth every morning.           Allergies Iron  History reviewed. No pertinent family history.  Social History Social History  Substance Use Topics  . Smoking status: Current Every Day Smoker -- 0.25 packs/day for 20 years    Types: Cigarettes  . Smokeless tobacco: None  . Alcohol Use: Yes     Comment: beer/wine qhs    Review of Systems Constitutional: No fever/chills Eyes: No visual changes. ENT: No sore throat. Cardiovascular: Denies chest pain. Respiratory: Denies shortness of breath. Gastrointestinal: No abdominal pain.  No nausea, no vomiting.  No diarrhea.  No constipation. Genitourinary: Negative for dysuria. Musculoskeletal: Positive for lumbar sacrum point tenderness. Skin: Negative for rash. Neurological: Negative for headaches, focal weakness or  numbness.  10-point ROS otherwise negative.  ____________________________________________   PHYSICAL EXAM:  VITAL SIGNS: ED Triage Vitals  Enc Vitals Group     BP 11/13/15 1559 171/79 mmHg     Pulse Rate 11/13/15 1559 102     Resp 11/13/15 1559 20     Temp 11/13/15 1559 98.3 F (36.8 C)     Temp src --      SpO2 11/13/15 1559 96 %     Weight 11/13/15 1559 203 lb (92.08 kg)     Height 11/13/15 1559  (1.651 m)     Head Cir --      Peak Flow --      Pain Score 11/13/15 1600 9     Pain Loc --      Pain Edu? --      Excl. in GC? --     Constitutional: Alert and oriented. Well appearing and in no acute distress. Neck: No stridor.  Supple, full range of motion Cardiovascular: Normal rate, regular rhythm. Grossly normal heart sounds.  Good peripheral circulation. Respiratory: Normal respiratory effort.  No retractions. Lungs  CTAB. Gastrointestinal: Soft and nontender. No distention. No abdominal bruits. No CVA tenderness. Musculoskeletal: Lumbar sacrum point tenderness with no ecchymosis or bruising noted. Straight leg raise unremarkable patient able to bear weight and walk without difficulty. Neurologic:  Normal speech and language. No gross focal neurologic deficits are appreciated. No gait instability. Skin:  Skin is warm, dry and intact. No rash noted. Psychiatric: Mood and affect are normal. Speech and behavior are normal.  ____________________________________________   LABS (all labs ordered are listed, but only abnormal results are displayed)  Labs Reviewed - No data to display ____________________________________________   RADIOLOGY  Negative for any acute osseous findings. ____________________________________________   PROCEDURES  Procedure(s) performed: None  Critical Care performed: No  ____________________________________________   INITIAL IMPRESSION / ASSESSMENT AND PLAN / ED COURSE  Pertinent labs & imaging results that were available during my care of the patient were reviewed by me and considered in my medical decision making (see chart for details).  Status post fall with acute lumbar contusion. Sacral contusion. Rx given for ibuprofen 800 mg 3 times a day and baclofen 10 mg 3 times a day. Patient follow-up PCP or return here with any worsening symptomology. Work excuse 24 hours given. Patient voices no other emergency medical complaints at this time. ____________________________________________   FINAL CLINICAL IMPRESSION(S) / ED DIAGNOSES  Final diagnoses:  Contusion of sacrum, initial encounter      Evangeline Dakin, PA-C 11/13/15 1734  Arnaldo Natal, MD 11/13/15 2100

## 2015-11-13 NOTE — Discharge Instructions (Signed)
Tailbone Injury °The tailbone (coccyx) is the small bone at the lower end of the spine. A tailbone injury may involve stretched ligaments, bruising, or a broken bone (fracture). Tailbone injuries can be painful, and some may take a long time to heal. °CAUSES °This condition is often caused by falling and landing on the tailbone. Other causes include: °· Repeated strain or friction from actions such as rowing and bicycling. °· Childbirth. °In some cases, the cause may not be known. °RISK FACTORS °This condition is more common in women than in men. °SYMPTOMS °Symptoms of this condition include: °· Pain in the lower back, especially when sitting. °· Pain or difficulty when standing up from a sitting position. °· Bruising in the tailbone area. °· Painful bowel movements. °· In women, pain during intercourse. °DIAGNOSIS °This condition may be diagnosed based on your symptoms and a physical exam. X-rays may be taken if a fracture is suspected. You may also have other tests, such as a CT scan or MRI. °TREATMENT °This condition may be treated with medicines to help relieve your pain. Most tailbone injuries heal on their own in 4-6 weeks. However, recovery time may be longer if the injury involves a fracture. °HOME CARE INSTRUCTIONS °· Take medicines only as directed by your health care provider. °· If directed, apply ice to the injured area: °¨ Put ice in a plastic bag. °¨ Place a towel between your skin and the bag. °¨ Leave the ice on for 20 minutes, 2-3 times per day for the first 1-2 days. °· Sit on a large, rubber or inflated ring or cushion to ease your pain. Lean forward when you are sitting to help decrease discomfort. °· Avoid sitting for long periods of time. °· Increase your activity as the pain allows. Perform any exercises that are recommended by your health care provider or physical therapist. °· If you have pain during bowel movements, use stool softeners as directed by your health care provider. °· Eat a  diet that includes plenty of fiber to help prevent constipation. °· Keep all follow-up visits as directed by your health care provider. This is important. °PREVENTION °Wear appropriate padding and sports gear when bicycling and rowing. This can help to prevent developing an injury that is caused by repeated strain or friction. °SEEK MEDICAL CARE IF: °· Your pain becomes worse. °· Your bowel movements cause a great deal of discomfort. °· You are unable to have a bowel movement. °· You have uncontrolled urine loss (urinary incontinence). °· You have a fever. °  °This information is not intended to replace advice given to you by your health care provider. Make sure you discuss any questions you have with your health care provider. °  °Document Released: 10/05/2000 Document Revised: 02/22/2015 Document Reviewed: 10/04/2014 °Elsevier Interactive Patient Education ©2016 [employer] ° °

## 2015-11-28 ENCOUNTER — Other Ambulatory Visit: Payer: Self-pay | Admitting: Family Medicine

## 2015-11-28 DIAGNOSIS — N63 Unspecified lump in unspecified breast: Secondary | ICD-10-CM

## 2015-12-12 ENCOUNTER — Other Ambulatory Visit: Payer: Self-pay | Admitting: Family Medicine

## 2015-12-12 ENCOUNTER — Ambulatory Visit
Admission: RE | Admit: 2015-12-12 | Discharge: 2015-12-12 | Disposition: A | Discharge status: Home / Self Care | Payer: BLUE CROSS/BLUE SHIELD | Source: Ambulatory Visit | Attending: Family Medicine | Admitting: Family Medicine

## 2015-12-12 DIAGNOSIS — N63 Unspecified lump in unspecified breast: Secondary | ICD-10-CM

## 2016-04-25 ENCOUNTER — Other Ambulatory Visit: Payer: Self-pay | Admitting: Internal Medicine

## 2016-04-25 ENCOUNTER — Ambulatory Visit
Admission: RE | Admit: 2016-04-25 | Discharge: 2016-04-25 | Disposition: A | Discharge status: Home / Self Care | Payer: BLUE CROSS/BLUE SHIELD | Source: Ambulatory Visit | Attending: Internal Medicine | Admitting: Internal Medicine

## 2016-04-25 DIAGNOSIS — M546 Pain in thoracic spine: Secondary | ICD-10-CM

## 2016-04-25 DIAGNOSIS — M549 Dorsalgia, unspecified: Secondary | ICD-10-CM | POA: Insufficient documentation

## 2016-04-25 DIAGNOSIS — M5134 Other intervertebral disc degeneration, thoracic region: Secondary | ICD-10-CM | POA: Insufficient documentation

## 2016-04-30 ENCOUNTER — Other Ambulatory Visit: Payer: Self-pay | Admitting: Internal Medicine

## 2016-04-30 DIAGNOSIS — Z1231 Encounter for screening mammogram for malignant neoplasm of breast: Secondary | ICD-10-CM

## 2016-06-22 ENCOUNTER — Ambulatory Visit
Admission: RE | Admit: 2016-06-22 | Discharge: 2016-06-22 | Disposition: A | Discharge status: Home / Self Care | Payer: BLUE CROSS/BLUE SHIELD | Source: Ambulatory Visit | Attending: Internal Medicine | Admitting: Internal Medicine

## 2016-06-22 ENCOUNTER — Other Ambulatory Visit: Payer: Self-pay | Admitting: Internal Medicine

## 2016-06-22 DIAGNOSIS — Z1231 Encounter for screening mammogram for malignant neoplasm of breast: Secondary | ICD-10-CM | POA: Diagnosis present

## 2017-02-06 ENCOUNTER — Other Ambulatory Visit: Payer: Self-pay | Admitting: Internal Medicine

## 2017-02-06 DIAGNOSIS — Z1231 Encounter for screening mammogram for malignant neoplasm of breast: Secondary | ICD-10-CM

## 2017-02-17 ENCOUNTER — Encounter: Payer: Self-pay | Admitting: Emergency Medicine

## 2017-02-17 ENCOUNTER — Emergency Department
Admission: EM | Admit: 2017-02-17 | Discharge: 2017-02-17 | Disposition: A | Discharge status: Home / Self Care | Payer: BLUE CROSS/BLUE SHIELD | Attending: Emergency Medicine | Admitting: Emergency Medicine

## 2017-02-17 DIAGNOSIS — J301 Allergic rhinitis due to pollen: Secondary | ICD-10-CM | POA: Insufficient documentation

## 2017-02-17 DIAGNOSIS — Z79899 Other long term (current) drug therapy: Secondary | ICD-10-CM | POA: Insufficient documentation

## 2017-02-17 DIAGNOSIS — I10 Essential (primary) hypertension: Secondary | ICD-10-CM | POA: Diagnosis not present

## 2017-02-17 DIAGNOSIS — E119 Type 2 diabetes mellitus without complications: Secondary | ICD-10-CM | POA: Diagnosis not present

## 2017-02-17 DIAGNOSIS — Z87891 Personal history of nicotine dependence: Secondary | ICD-10-CM | POA: Diagnosis not present

## 2017-02-17 DIAGNOSIS — Z7984 Long term (current) use of oral hypoglycemic drugs: Secondary | ICD-10-CM | POA: Insufficient documentation

## 2017-02-17 DIAGNOSIS — R05 Cough: Secondary | ICD-10-CM | POA: Diagnosis present

## 2017-02-17 MED ORDER — BENZONATATE 100 MG PO CAPS: 200.0000 mg | ORAL_CAPSULE | Freq: Three times a day (TID) | ORAL | 0 refills | Status: AC | PRN

## 2017-02-17 NOTE — ED Provider Notes (Signed)
[facility] Emergency Department Provider Note  ____________________________________________   First MD Initiated Contact with Patient 02/17/17 1302     (approximate)  I have reviewed the triage vital signs and the nursing notes.   HISTORY  Chief Complaint Nasal Congestion and Cough    HPI Karen Pitts is a 56 y.o. female history of complaint a nonproductive cough and nasal congestion for approximately 3 days. Patient states that she had a subjective fever last week. She has been sneezing a lot. She denies any throat pain, nausea or vomiting. Patient states that her doctor called a prescription for Flonase into the pharmacy which she has not picked up yet. Patient states she was a former smoker but quit last year.    Past Medical History:  Diagnosis Date  . Diabetes mellitus without complication (HCC)    no meds currently  . Hypertension     There are no active problems to display for this patient.   Past Surgical History:  Procedure Laterality Date  . BREAST EXCISIONAL BIOPSY Right 2006   EXCISIONAL - NEG  . BREAST SURGERY     right breast  . CARPAL TUNNEL RELEASE Right 02/28/2015   Procedure: CARPAL TUNNEL RELEASE;  Surgeon: Donato Heinz, MD;  Location: [facility] ORS;  Service: Orthopedics;  Laterality: Right;  . CARPAL TUNNEL RELEASE Left 04/01/2015   Procedure: CARPAL TUNNEL RELEASE;  Surgeon: Donato Heinz, MD;  Location: [facility] ORS;  Service: Orthopedics;  Laterality: Left;  . WISDOM TOOTH EXTRACTION      Prior to Admission medications   Medication Sig Start Date End Date Taking? Authorizing Provider  aspirin 81 MG tablet Take 81 mg by mouth daily.   Yes Historical Provider, MD  hydrochlorothiazide (HYDRODIURIL) 25 MG tablet Take 25 mg by mouth daily.   Yes Historical Provider, MD  losartan (COZAAR) 100 MG tablet Take 100 mg by mouth every morning.   Yes Historical Provider, MD  metFORMIN (GLUCOPHAGE) 500 MG tablet Take by mouth daily.    Yes Historical Provider, MD  methocarbamol (ROBAXIN) 500 MG tablet Take 500 mg by mouth 2 (two) times daily.   Yes Historical Provider, MD  Multiple Vitamin (MULTIVITAMIN) capsule Take 1 capsule by mouth daily.   Yes Historical Provider, MD  simvastatin (ZOCOR) 20 MG tablet Take 20 mg by mouth every morning.   Yes Historical Provider, MD  benzonatate (TESSALON PERLES) 100 MG capsule Take 2 capsules (200 mg total) by mouth 3 (three) times daily as needed. 02/17/17 02/17/18  Tommi Rumps, PA-C  gabapentin (NEURONTIN) 400 MG capsule Take 400 mg by mouth 3 (three) times daily.     Historical Provider, MD    Allergies Iron  History reviewed. No pertinent family history.  Social History Social History  Substance Use Topics  . Smoking status: Former Smoker    Packs/day: 0.25    Years: 20.00    Types: Cigarettes    Quit date: 2017  . Smokeless tobacco: Never Used  . Alcohol use Yes     Comment: beer/wine qhs    Review of Systems Constitutional: No fever/chills Eyes: No visual changes. ENT: Positive for nasal congestion. Positive for ear pain. Cardiovascular: Denies chest pain. Respiratory: Denies shortness of breath. Positive for cough. Gastrointestinal:  No nausea, no vomiting.   Musculoskeletal: Negative for back pain. Skin: Negative for rash. Neurological: Negative for headaches, focal weakness or numbness.   ____________________________________________   PHYSICAL EXAM:  VITAL SIGNS: ED Triage Vitals  Enc Vitals Group  BP 02/17/17 1246 130/71     Pulse Rate 02/17/17 1246 88     Resp 02/17/17 1246 18     Temp 02/17/17 1246 98.9 F (37.2 C)     Temp Source 02/17/17 1246 Oral     SpO2 02/17/17 1246 96 %     Weight 02/17/17 1247 211 lb (95.7 kg)     Height 02/17/17 1247 5' 5.5" (1.664 m)     Head Circumference --      Peak Flow --      Pain Score --      Pain Loc --      Pain Edu? --      Excl. in GC? --     Constitutional: Alert and oriented. Well  appearing and in no acute distress. Eyes: Conjunctivae are normal. PERRL. EOMI. Head: Atraumatic. Nose: Mild congestion/no rhinnorhea. TMs are dull bilaterally but no injection or erythema was noted. Mouth/Throat: Mucous membranes are moist.  Oropharynx non-erythematous. Positive posterior drainage noted. Neck: No stridor.   Hematological/Lymphatic/Immunilogical: No cervical lymphadenopathy. Cardiovascular: Normal rate, regular rhythm. Grossly normal heart sounds.  Good peripheral circulation. Respiratory: Normal respiratory effort.  No retractions. Lungs CTAB. Gastrointestinal: Soft and nontender. No distention. Musculoskeletal: No lower extremity tenderness nor edema.  No joint effusions. Neurologic:  Normal speech and language. No gross focal neurologic deficits are appreciated. No gait instability. Skin:  Skin is warm, dry and intact. No rash noted. Psychiatric: Mood and affect are normal. Speech and behavior are normal.  ____________________________________________   LABS (all labs ordered are listed, but only abnormal results are displayed)  Labs Reviewed - No data to display   PROCEDURES  Procedure(s) performed: None  Procedures  Critical Care performed: No  ____________________________________________   INITIAL IMPRESSION / ASSESSMENT AND PLAN / ED COURSE  Pertinent labs & imaging results that were available during my care of the patient were reviewed by me and considered in my medical decision making (see chart for details).  Patient will get that prescription for Flonase filled out her primary care wrote for her and is at the pharmacy. She'll also obtain over-the-counter Zyrtec as needed for allergies. She was given a prescription for Tessalon Perles to take capsules every 8 hours as needed for cough. She is to increase fluids and follow-up with her primary care if any continued problems.      ____________________________________________   FINAL CLINICAL  IMPRESSION(S) / ED DIAGNOSES  Final diagnoses:  Seasonal allergic rhinitis due to pollen      NEW MEDICATIONS STARTED DURING THIS VISIT:  Discharge Medication List as of 02/17/2017  1:16 PM    START taking these medications   Details  benzonatate (TESSALON PERLES) 100 MG capsule Take 2 capsules (200 mg total) by mouth 3 (three) times daily as needed., Starting Sun 02/17/2017, Until Mon 02/17/2018, Print         Note:  This document was prepared using Dragon voice recognition software and may include unintentional dictation errors.    Tommi Rumps, PA-C 02/17/17 1453    Myrna Blazer, MD 02/17/17 1540

## 2017-02-17 NOTE — ED Triage Notes (Signed)
Pt presents to ED c/o non-productive cough and congestion starting Thursday, improved, and returned today. Works in [employer] and has been around sick patients.

## 2017-02-17 NOTE — Discharge Instructions (Signed)
Follow-up with your primary care doctor if any continued problems. Begin taking Tessalon 2 capsules every 8 hours as needed for cough. Begin using the Flonase nasal spray that your doctor prescribed. Also obtain over-the-counter Zyrtec and take once daily. If needed you may still  take NyQuil at night only.

## 2017-02-21 IMAGING — MG MM DIAG BREAST TOMO UNI LEFT
7 series · 8 of 15 positions shown · non-contrast
Comparison: Previous exam(s).

CLINICAL DATA: 55-year-old female who states that she has had
difficulty raising her left arm since Sunday December, 2014 around the time
of her carpal tunnel surgery. She was recently seen by her clinician
who felt a lump in the posterior, lateral aspect of the left axilla.
The patient also notes tenderness in this region.

EXAM:
DIGITAL DIAGNOSTIC LEFT MAMMOGRAM WITH 3D TOMOSYNTHESIS WITH CAD
ULTRASOUND LEFT BREAST

[L AT]
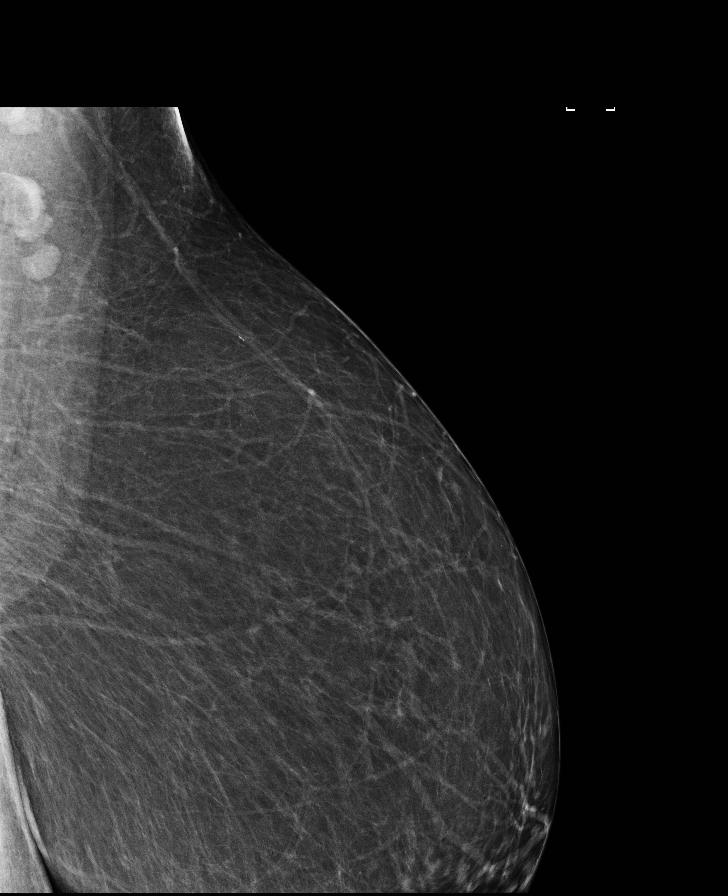

[L CC]
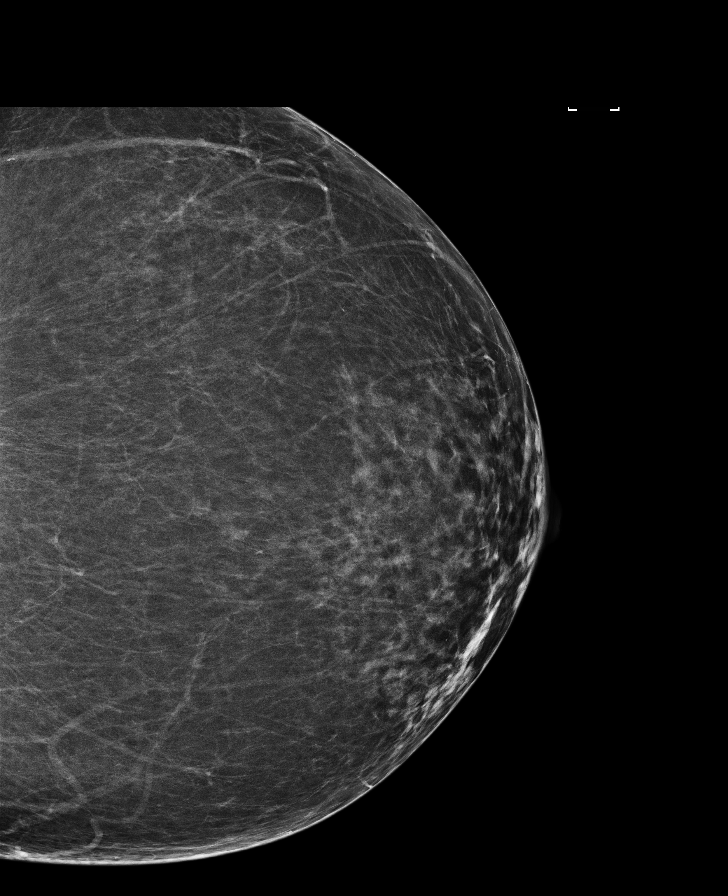

[L MLO]
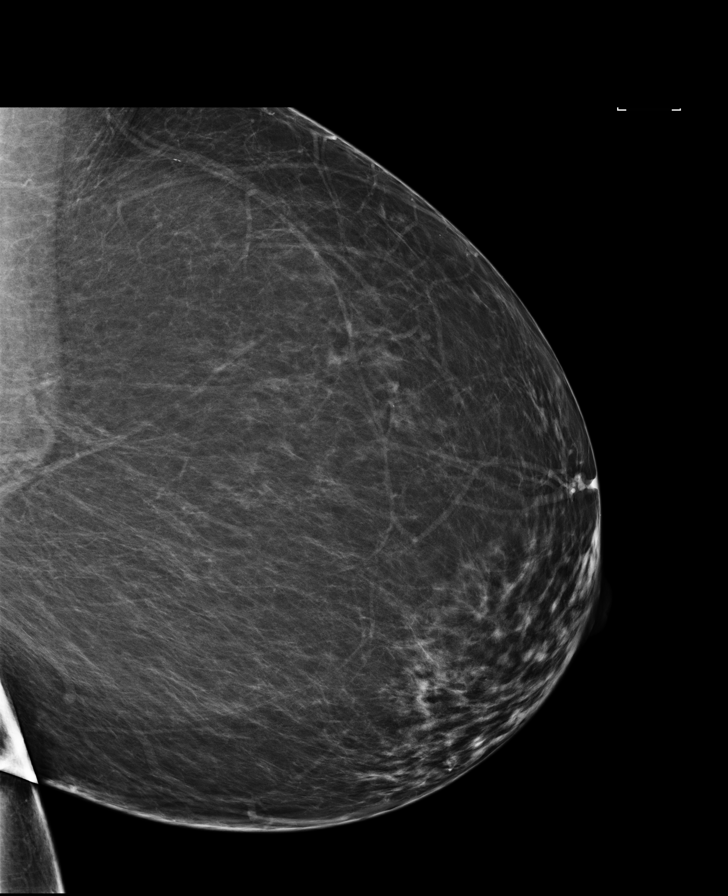

[L MLO synth-2D]
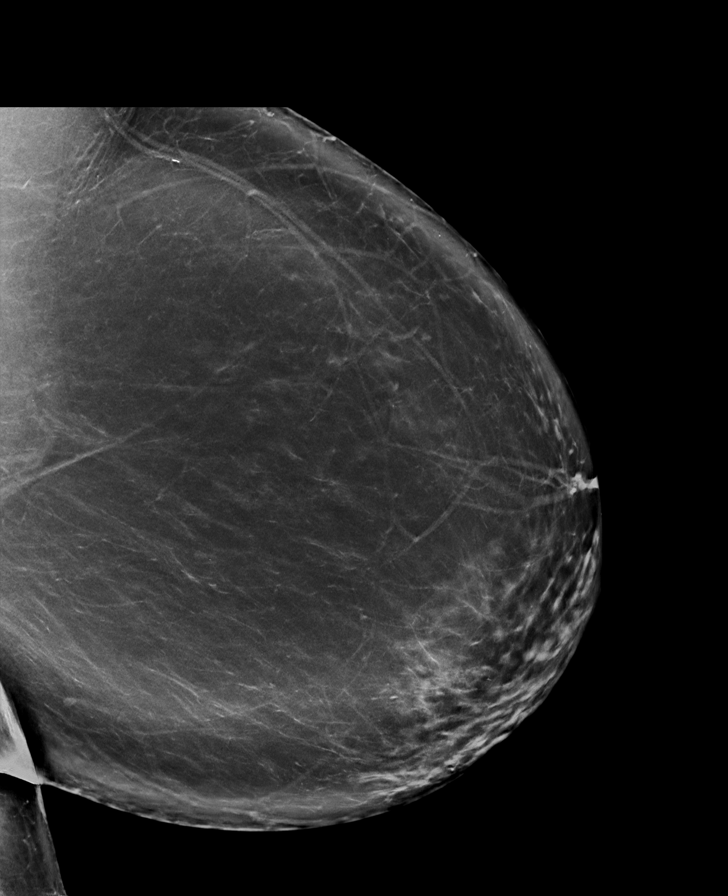

[L CC synth-2D]
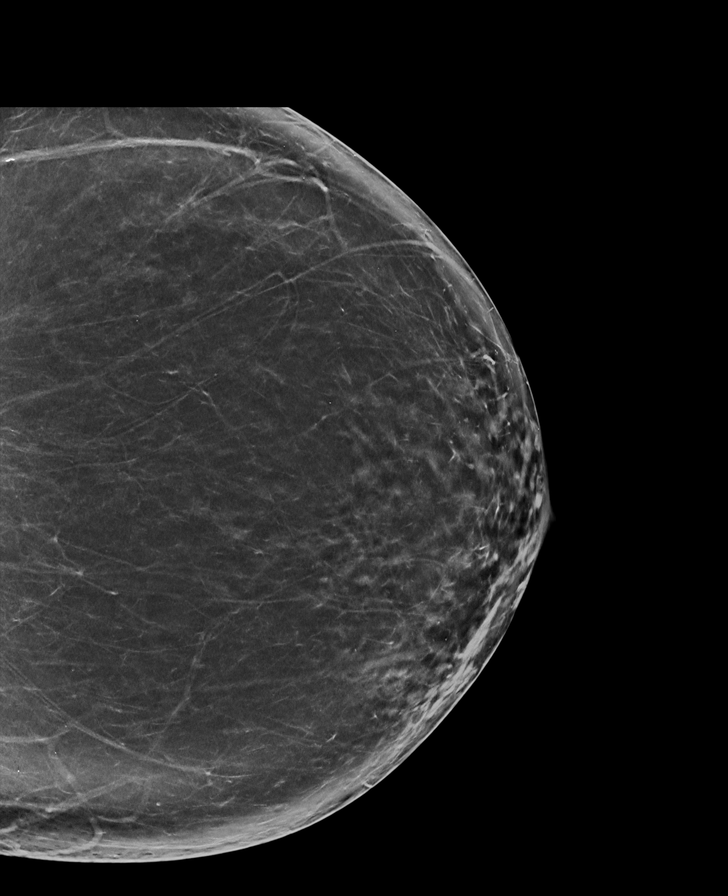

[L MLO tomo · 2 of 102 frames shown]
[frame 33/102]
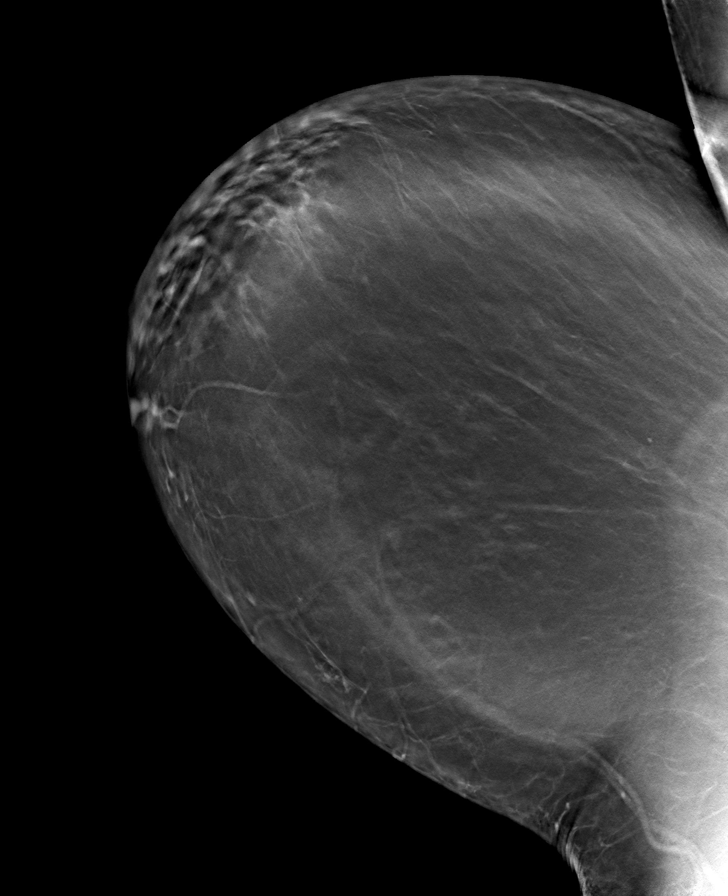
[frame 51/102]
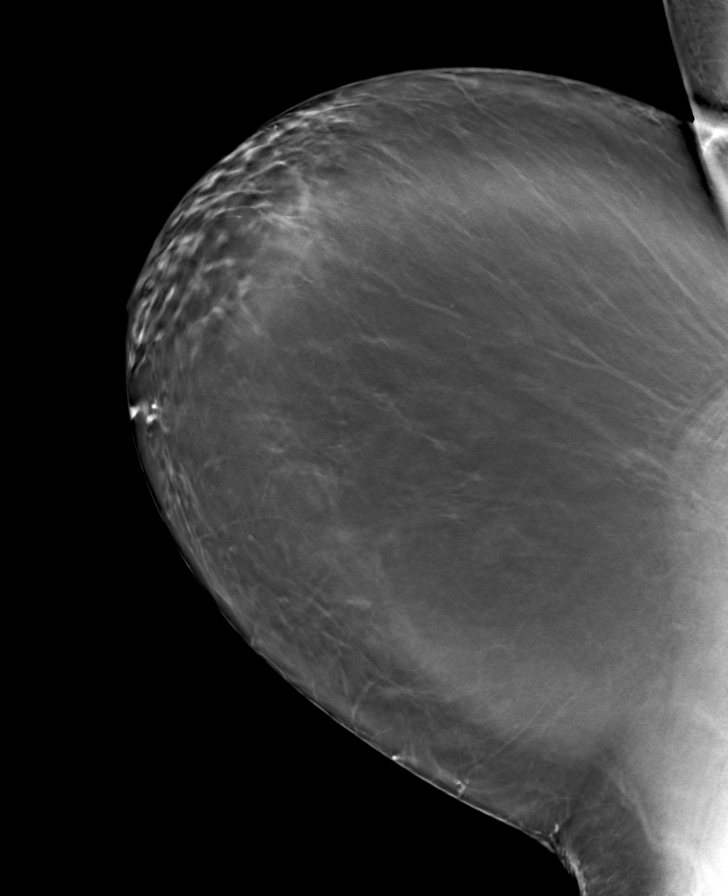

[L CC tomo · tomo slice 44/87.0]
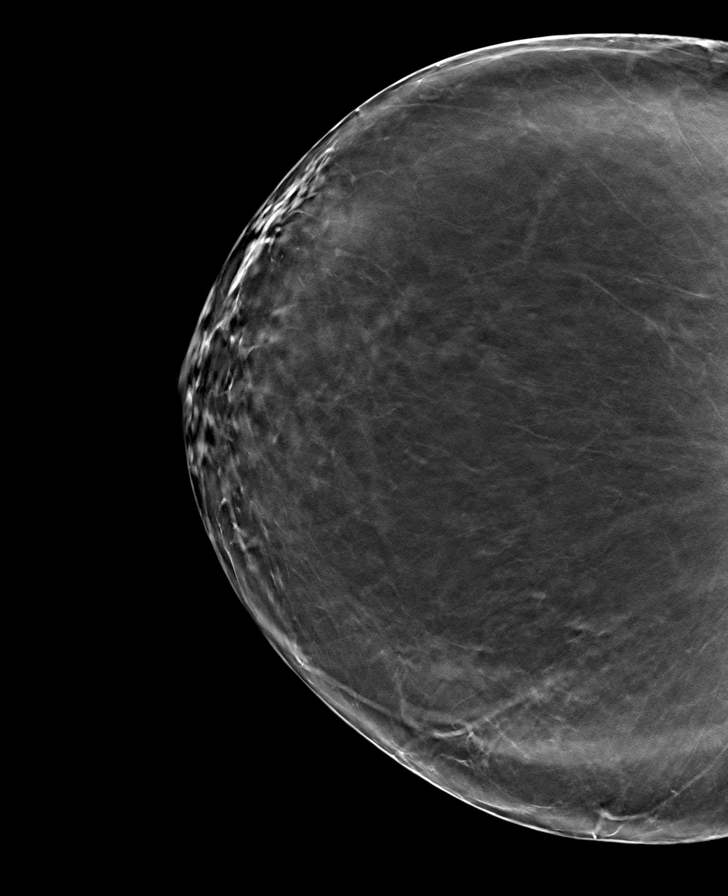

[8 of 15 positions shown; findings below may reference images not displayed]

ACR Breast Density Category c: The breast tissue is heterogeneously
dense, which may obscure small masses.
FINDINGS: No suspicious mass, calcifications, or other abnormality is
identified within the left breast. The area of concern within the
lateral left axilla is not included in today's mammogram due to its
far posterior, lateral location.

Mammographic images were processed with CAD.

On physical exam, no discrete mass is felt in the area of concern
within the posterior, lateral left axilla.

Targeted ultrasound is performed, showing no suspicious cystic or
solid sonographic finding in the area of concern within the lateral
left axilla.
IMPRESSION: No mammographic or sonographic evidence of breast malignancy.

RECOMMENDATION:
1. Bilateral screening mammogram in May 2016.
2. The patient was instructed to follow-up with her referring
clinician regarding the palpable area of concern and her left
shoulder pain.

I have discussed the findings and recommendations with the patient.
Results were also provided in writing at the conclusion of the
visit. If applicable, a reminder letter will be sent to the patient
regarding the next appointment.

BI-RADS CATEGORY  1: Negative.

## 2017-06-25 ENCOUNTER — Ambulatory Visit: Payer: BLUE CROSS/BLUE SHIELD

## 2017-07-08 ENCOUNTER — Ambulatory Visit
Admission: RE | Admit: 2017-07-08 | Discharge: 2017-07-08 | Disposition: A | Discharge status: Home / Self Care | Payer: BLUE CROSS/BLUE SHIELD | Source: Ambulatory Visit | Attending: Internal Medicine | Admitting: Internal Medicine

## 2017-07-08 DIAGNOSIS — Z1231 Encounter for screening mammogram for malignant neoplasm of breast: Secondary | ICD-10-CM | POA: Insufficient documentation

## 2017-12-29 DIAGNOSIS — R079 Chest pain, unspecified: Secondary | ICD-10-CM | POA: Insufficient documentation

## 2017-12-29 DIAGNOSIS — R011 Cardiac murmur, unspecified: Secondary | ICD-10-CM | POA: Insufficient documentation

## 2017-12-29 DIAGNOSIS — R42 Dizziness and giddiness: Secondary | ICD-10-CM | POA: Insufficient documentation

## 2018-03-21 ENCOUNTER — Encounter: Payer: Self-pay | Admitting: Emergency Medicine

## 2018-03-21 ENCOUNTER — Emergency Department
Admission: EM | Admit: 2018-03-21 | Discharge: 2018-03-21 | Disposition: A | Discharge status: Home / Self Care | Payer: BLUE CROSS/BLUE SHIELD | Attending: Emergency Medicine | Admitting: Emergency Medicine

## 2018-03-21 ENCOUNTER — Other Ambulatory Visit: Payer: Self-pay

## 2018-03-21 ENCOUNTER — Emergency Department: Payer: BLUE CROSS/BLUE SHIELD

## 2018-03-21 DIAGNOSIS — S29012A Strain of muscle and tendon of back wall of thorax, initial encounter: Secondary | ICD-10-CM | POA: Diagnosis not present

## 2018-03-21 DIAGNOSIS — Y939 Activity, unspecified: Secondary | ICD-10-CM | POA: Diagnosis not present

## 2018-03-21 DIAGNOSIS — Z7982 Long term (current) use of aspirin: Secondary | ICD-10-CM | POA: Diagnosis not present

## 2018-03-21 DIAGNOSIS — E119 Type 2 diabetes mellitus without complications: Secondary | ICD-10-CM | POA: Insufficient documentation

## 2018-03-21 DIAGNOSIS — I10 Essential (primary) hypertension: Secondary | ICD-10-CM | POA: Diagnosis not present

## 2018-03-21 DIAGNOSIS — Y999 Unspecified external cause status: Secondary | ICD-10-CM | POA: Diagnosis not present

## 2018-03-21 DIAGNOSIS — Z87891 Personal history of nicotine dependence: Secondary | ICD-10-CM | POA: Diagnosis not present

## 2018-03-21 DIAGNOSIS — Z7984 Long term (current) use of oral hypoglycemic drugs: Secondary | ICD-10-CM | POA: Insufficient documentation

## 2018-03-21 DIAGNOSIS — Y929 Unspecified place or not applicable: Secondary | ICD-10-CM | POA: Insufficient documentation

## 2018-03-21 DIAGNOSIS — Z79899 Other long term (current) drug therapy: Secondary | ICD-10-CM | POA: Diagnosis not present

## 2018-03-21 DIAGNOSIS — X58XXXA Exposure to other specified factors, initial encounter: Secondary | ICD-10-CM | POA: Diagnosis not present

## 2018-03-21 DIAGNOSIS — T148XXA Other injury of unspecified body region, initial encounter: Secondary | ICD-10-CM

## 2018-03-21 DIAGNOSIS — S299XXA Unspecified injury of thorax, initial encounter: Secondary | ICD-10-CM | POA: Diagnosis present

## 2018-03-21 LAB — BASIC METABOLIC PANEL
ANION GAP: 11 (ref 5–15)
BUN: 23 mg/dL — AB (ref 6–20)
CALCIUM: 9.4 mg/dL (ref 8.9–10.3)
CO2: 25 mmol/L (ref 22–32)
Chloride: 102 mmol/L (ref 101–111)
Creatinine, Ser: 0.83 mg/dL (ref 0.44–1.00)
GFR calc Af Amer: >60 mL/min (ref >60)
Glucose, Bld: 123 mg/dL — ABNORMAL HIGH (ref 65–99)
Potassium: 3.4 mmol/L — ABNORMAL LOW (ref 3.5–5.1)
SODIUM: 138 mmol/L (ref 135–145)

## 2018-03-21 LAB — TROPONIN I

## 2018-03-21 LAB — CBC
HCT: 37.6 % (ref 35.0–47.0)
Hemoglobin: 13.1 g/dL (ref 12.0–16.0)
MCH: 30.5 pg (ref 26.0–34.0)
MCHC: 34.7 g/dL (ref 32.0–36.0)
MCV: 88 fL (ref 80.0–100.0)
Platelets: 236 10*3/uL (ref 150–440)
RBC: 4.28 MIL/uL (ref 3.80–5.20)
RDW: 15 % — AB (ref 11.5–14.5)
WBC: 9.8 10*3/uL (ref 3.6–11.0)

## 2018-03-21 MED ORDER — NAPROXEN 500 MG PO TABS: 500.0000 mg | ORAL_TABLET | Freq: Two times a day (BID) | ORAL | 1 refills | Status: AC

## 2018-03-21 MED ORDER — CYCLOBENZAPRINE HCL 5 MG PO TABS: 5.0000 mg | ORAL_TABLET | Freq: Three times a day (TID) | ORAL | 0 refills | Status: DC | PRN

## 2018-03-21 MED ORDER — NAPROXEN 500 MG PO TABS
500.0000 mg | ORAL_TABLET | Freq: Once | ORAL | Status: AC
  Administered 2018-03-21: 500 mg via ORAL
  Filled 2018-03-21: qty 1

## 2018-03-21 NOTE — ED Triage Notes (Signed)
Pt c/o left mid back pain along with right shoulder pain X 2 weeks.  [facility]  C/o dizziness when turns head too quick.  Has had cough. VSS. Ambulatory.

## 2018-03-21 NOTE — Discharge Instructions (Addendum)
Your exam, labs, EKG, and chest x-ray are normal at this time. You appear to be having muscle strain to the midback. Take the prescription meds as directed. Follow-up with your provider for ongoing symptoms. Return as needed.

## 2018-03-21 NOTE — ED Notes (Signed)
Pt stating that she has been having left sided back pain for the last 2 weeks. Pt stating that she had to call off from work because of the pain today. Pt stating pain in her right should/neck also.  Pt stating no fevers or issues with urination. Pt stating cough.

## 2018-03-21 NOTE — ED Provider Notes (Addendum)
[facility] Emergency Department Provider Note ____________________________________________  Time seen: 1722  I have reviewed the triage vital signs and the nursing notes.  HISTORY  Chief Complaint  Back Pain  HPI Karen Pitts is a 57 y.o. female presents to the ED accompanied by family members, for evaluation of mid back pain.  She describes left-sided mid back pain along the right shoulder blade.  She describes the pain has been there intermittently for the last 2 weeks.  She is unsure of any particular incident, but reports working with some overhead activities on her job.  She denies any shortness of breath, distal paresthesias, or chest pain.  She has noticed some dizziness when she turns her head too quickly but denies any syncope, vision change, or anxiety.  She has had intermittent cough as well as not sure if there is a connection between the symptoms.  She has been taking previously prescribed muscle relaxant without significant benefit to this more acute pain.  Past Medical History:  Diagnosis Date  . Diabetes mellitus without complication (HCC)    no meds currently  . Hypertension     There are no active problems to display for this patient.   Past Surgical History:  Procedure Laterality Date  . BREAST EXCISIONAL BIOPSY Right 2006   EXCISIONAL - NEG  . BREAST SURGERY     right breast  . CARPAL TUNNEL RELEASE Right 02/28/2015   Procedure: CARPAL TUNNEL RELEASE;  Surgeon: Donato Heinz, MD;  Location: [facility] ORS;  Service: Orthopedics;  Laterality: Right;  . CARPAL TUNNEL RELEASE Left 04/01/2015   Procedure: CARPAL TUNNEL RELEASE;  Surgeon: Donato Heinz, MD;  Location: [facility] ORS;  Service: Orthopedics;  Laterality: Left;  . WISDOM TOOTH EXTRACTION      Prior to Admission medications   Medication Sig Start Date End Date Taking? Authorizing Provider  aspirin 81 MG tablet Take 81 mg by mouth daily.    [provider]  cyclobenzaprine  (FLEXERIL) 5 MG tablet Take 1 tablet (5 mg total) by mouth 3 (three) times daily as needed for muscle spasms. 03/21/18   Jia Mohamed, Charlesetta Ivory, PA-C  gabapentin (NEURONTIN) 400 MG capsule Take 400 mg by mouth 3 (three) times daily.     [provider]  hydrochlorothiazide (HYDRODIURIL) 25 MG tablet Take 25 mg by mouth daily.    [provider]  losartan (COZAAR) 100 MG tablet Take 100 mg by mouth every morning.    [provider]  metFORMIN (GLUCOPHAGE) 500 MG tablet Take by mouth daily.    [provider]  methocarbamol (ROBAXIN) 500 MG tablet Take 500 mg by mouth 2 (two) times daily.    [provider]  Multiple Vitamin (MULTIVITAMIN) capsule Take 1 capsule by mouth daily.    [provider]  naproxen (NAPROSYN) 500 MG tablet Take 1 tablet (500 mg total) by mouth 2 (two) times daily with a meal. 03/21/18 04/20/18  Ashleymarie Granderson, Charlesetta Ivory, PA-C  simvastatin (ZOCOR) 20 MG tablet Take 20 mg by mouth every morning.    [provider]    Allergies Iron  History reviewed. No pertinent family history.  Social History Social History   Tobacco Use  . Smoking status: Former Smoker    Packs/day: 0.25    Years: 20.00    Pack years: 5.00    Types: Cigarettes    Last attempt to quit: 2017    Years since quitting: 2.4  . Smokeless tobacco: Never Used  Substance Use Topics  . Alcohol use: Yes    Comment: beer/wine qhs  . Drug use: No    Review of Systems  Constitutional: Negative for fever. Eyes: Negative for visual changes. ENT: Negative for sore throat. Cardiovascular: Negative for chest pain. No peripheral edema or DOE Respiratory: Negative for shortness of breath. Reports nonproductive cough Gastrointestinal: Negative for abdominal pain, vomiting and diarrhea. Genitourinary: Negative for dysuria, hematuria or retention. Musculoskeletal: Positive for midback pain. Skin: Negative for rash. Neurological: Negative for  headaches, focal weakness or numbness. Reports some positional dizziness ____________________________________________  PHYSICAL EXAM:  VITAL SIGNS: ED Triage Vitals  Enc Vitals Group     BP 03/21/18 1558 (!) 151/67     Pulse Rate 03/21/18 1558 87     Resp --      Temp 03/21/18 1558 98.9 F (37.2 C)     Temp Source 03/21/18 1558 Oral     SpO2 03/21/18 1558 97 %     Weight 03/21/18 1559 216 lb (98 kg)     Height 03/21/18 1559  (1.651 m)     Head Circumference --      Peak Flow --      Pain Score 03/21/18 1605 8     Pain Loc --      Pain Edu? --      Excl. in GC? --     Constitutional: Alert and oriented. Well appearing and in no distress. Head: Normocephalic and atraumatic. Eyes: Conjunctivae are normal. PERRL. Normal extraocular movements Ears: Canals clear. TMs intact bilaterally. Nose: No congestion/rhinorrhea/epistaxis. Mouth/Throat: Mucous membranes are moist. Neck: Supple. No thyromegaly.  Normal range of motion without crepitus.  Flexion of the neck does create some pain to the left scapular thoracic region.  No Distracting midline tenderness is noted. Cardiovascular: Normal rate, regular rhythm. No murmur, rubs, or gallops. Normal distal pulses. Respiratory: Normal respiratory effort. No wheezes/rales/rhonchi. Gastrointestinal: Soft and nontender. No distention. Musculoskeletal: No spinal alignment without midline tenderness, spasm, deformity, or step-off.  Normal upper extremity resistance testing.  Patient is tender to palpation over the left scapular thoracic region at the rhomboids.  This palpation reproduces tenderness patient has been noting.  Nontender with normal range of motion in all extremities.  Neurologic: Cranial nerves II through XII grossly intact.  Normal UE DTRs bilaterally.  Normal gait without ataxia. Normal speech and language. No gross focal neurologic deficits are appreciated. Skin:  Skin is warm, dry and intact. No rash  noted. ____________________________________________   LABS (pertinent positives/negatives) Labs Reviewed  BASIC METABOLIC PANEL - Abnormal; Notable for the following components:      Result Value   Potassium 3.4 (*)    Glucose, Bld 123 (*)    BUN 23 (*)    All other components within normal limits  CBC - Abnormal; Notable for the following components:   RDW 15.0 (*)    All other components within normal limits  TROPONIN I  ____________________________________________  EKG  Normal sinus rhythm 89 bpm PR interval  158 ms QRS duration 90 ms. No STEMI ____________________________________________   RADIOLOGY  Negative ____________________________________________  PROCEDURES  Procedures Naprosyn 500 mg PO ____________________________________________  INITIAL IMPRESSION / ASSESSMENT AND PLAN / ED COURSE  Differential diagnosis includes, but is not limited to, ACS, aortic dissection, pulmonary embolism, cardiac tamponade, pneumothorax, pneumonia, pericarditis, myocarditis, GI-related causes including esophagitis/gastritis, and musculoskeletal chest wall pain.    Patient with ED evaluation of left-sided scapulothoracic muscle pain.  She has a reducible pain is  been present intermittently for the last 2 weeks.  Patient's exam is overall benign.  Her labs, EKG, and chest x-ray are all reassuring at this time.  No cardiac enzyme elevation is noted.  Patient has reproducible pain on exam.  Symptoms likely represent an acute scapular thoracic muscle strain.  Patient will be discharged with a prescription for globin Zofran as well as naproxen.  Patient is reassured by her overall benign exam, labs, and chest x-ray.  She will follow with primary provider return to the ED as needed. ____________________________________________  FINAL CLINICAL IMPRESSION(S) / ED DIAGNOSES  Final diagnoses:  Muscle strain      Lissa Hoard, PA-C 03/24/18 1917    Dionne Bucy,  MD 04/01/18 [id number] Charlesetta Ivory, PA-C 04/02/18 1600    Dionne Bucy, MD 04/02/18 2315

## 2018-07-03 ENCOUNTER — Other Ambulatory Visit: Payer: Self-pay | Admitting: Internal Medicine

## 2018-07-03 DIAGNOSIS — Z1231 Encounter for screening mammogram for malignant neoplasm of breast: Secondary | ICD-10-CM

## 2018-07-15 ENCOUNTER — Ambulatory Visit
Admission: RE | Admit: 2018-07-15 | Discharge: 2018-07-15 | Disposition: A | Discharge status: Home / Self Care | Payer: BLUE CROSS/BLUE SHIELD | Source: Ambulatory Visit | Attending: Internal Medicine | Admitting: Internal Medicine

## 2018-07-15 DIAGNOSIS — Z1231 Encounter for screening mammogram for malignant neoplasm of breast: Secondary | ICD-10-CM | POA: Diagnosis present

## 2019-06-01 IMAGING — CR DG CHEST 2V
1 series · 2 of 2 positions shown · non-contrast
Comparison: None.

CLINICAL DATA: Shortness of breath

EXAM:
CHEST - 2 VIEW

[Series 1: dg chest 2 view · 0.14mm/px · 2 of 2 slices shown]
[im 1/2]
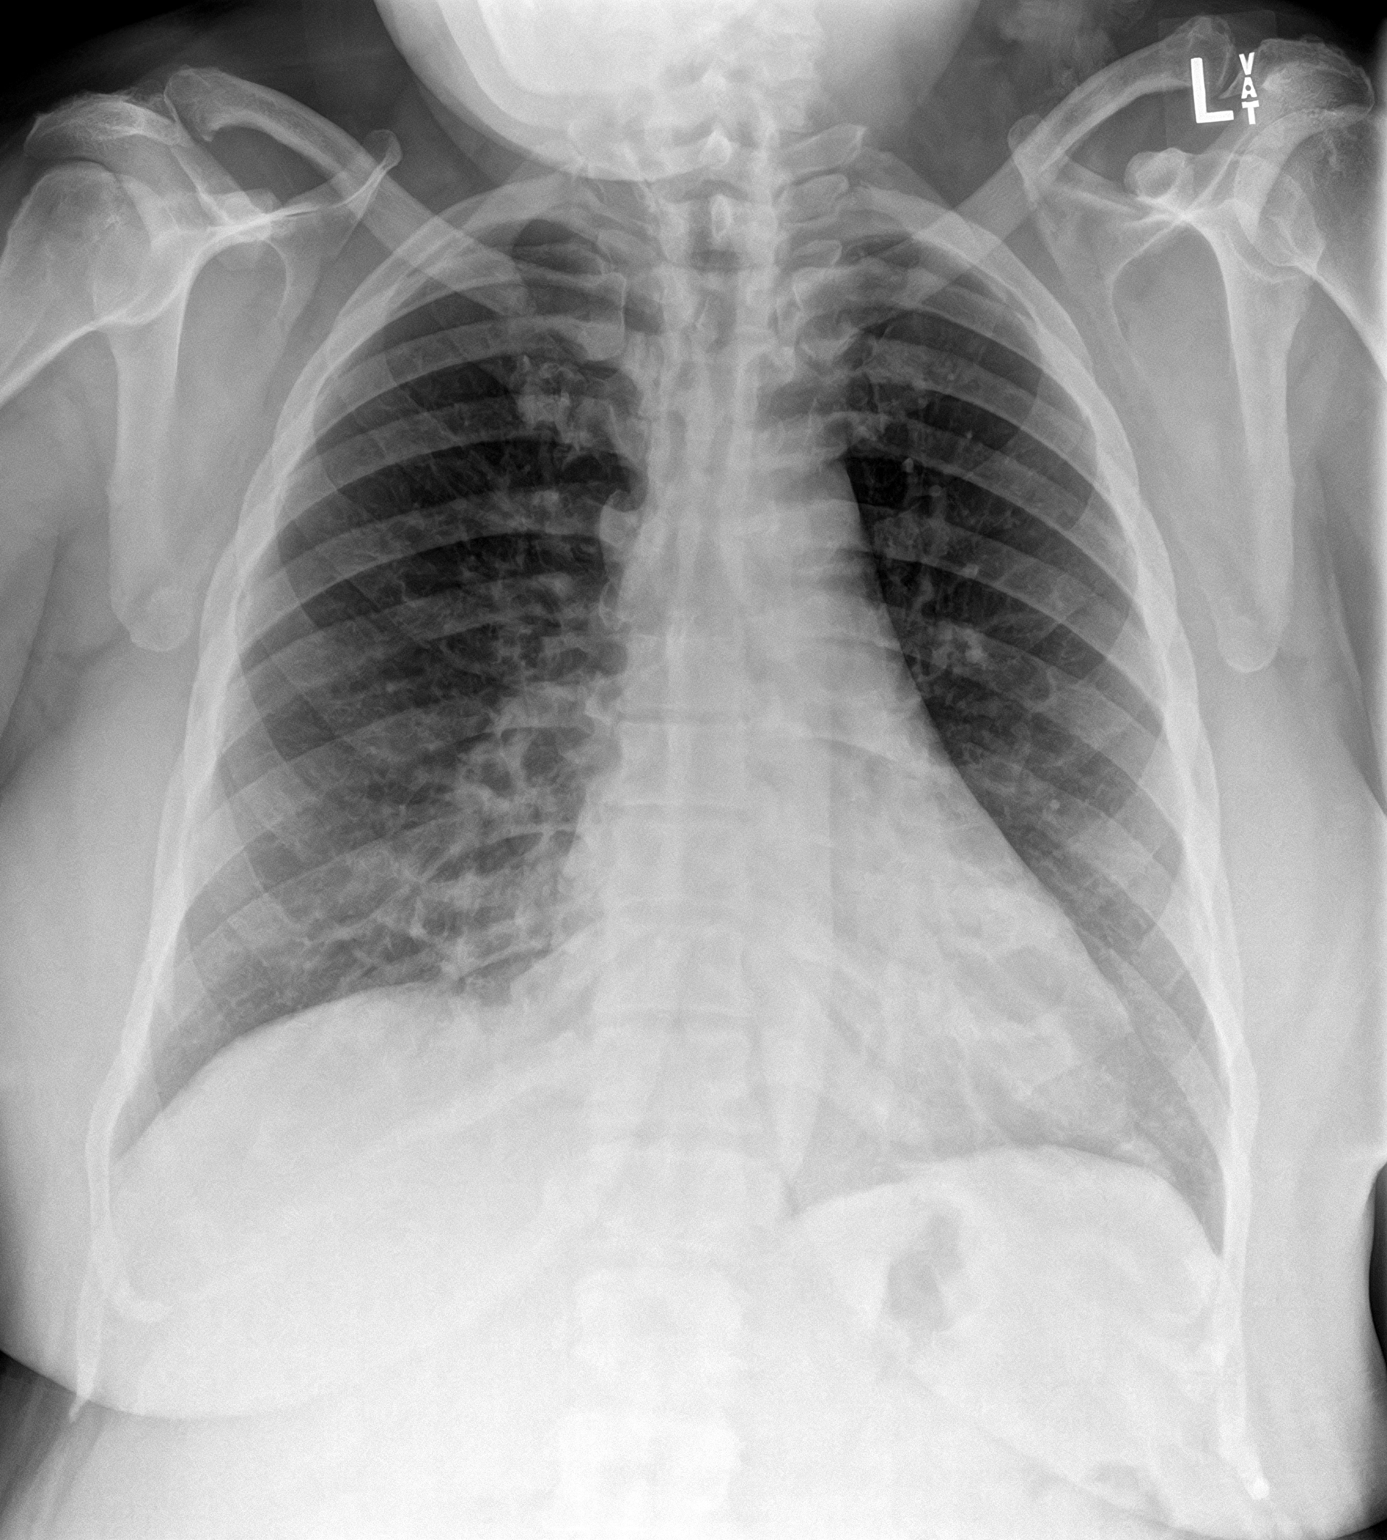
[im 2/2]
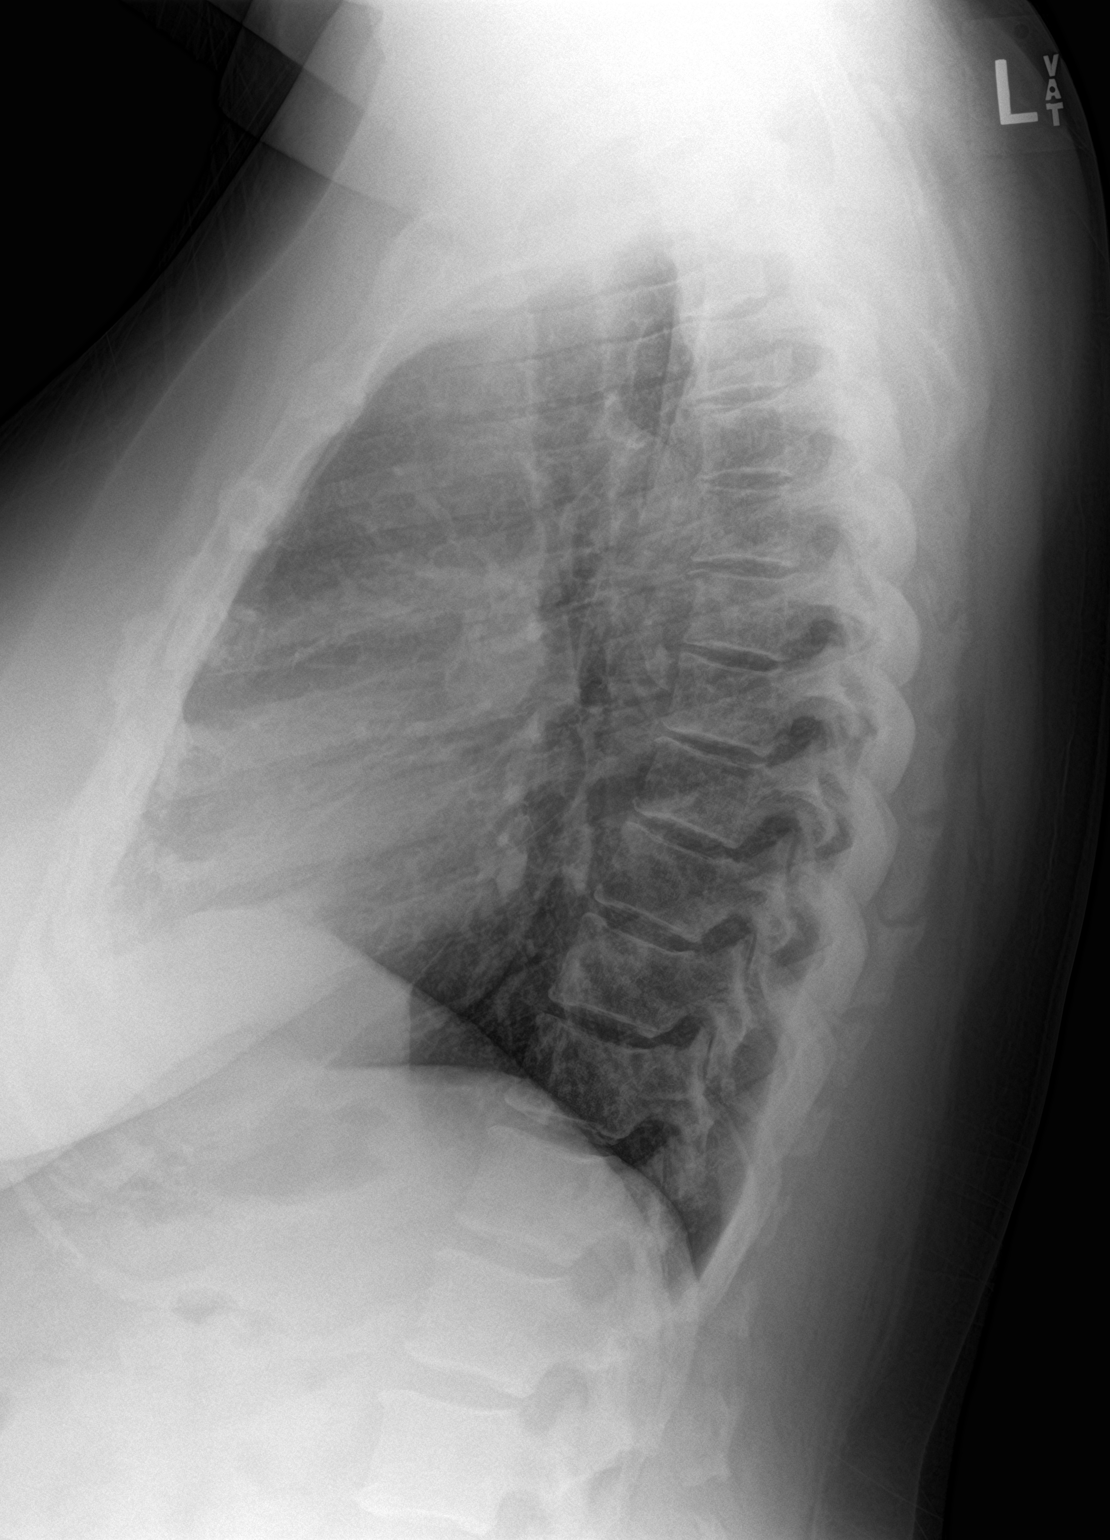

[2 of 2 positions shown; findings below may reference images not displayed]

FINDINGS: There is no edema or consolidation. The heart size and pulmonary
vascularity are normal. No adenopathy. No evident bone lesions.
IMPRESSION: No edema or consolidation.

## 2019-06-16 ENCOUNTER — Other Ambulatory Visit: Payer: Self-pay | Admitting: Internal Medicine

## 2019-06-16 DIAGNOSIS — Z1231 Encounter for screening mammogram for malignant neoplasm of breast: Secondary | ICD-10-CM

## 2019-06-22 ENCOUNTER — Other Ambulatory Visit: Payer: Self-pay

## 2019-06-22 ENCOUNTER — Encounter: Payer: Self-pay | Admitting: Podiatry

## 2019-06-22 ENCOUNTER — Ambulatory Visit (INDEPENDENT_AMBULATORY_CARE_PROVIDER_SITE_OTHER): Payer: BC Managed Care – PPO

## 2019-06-22 ENCOUNTER — Ambulatory Visit (INDEPENDENT_AMBULATORY_CARE_PROVIDER_SITE_OTHER): Payer: BC Managed Care – PPO | Admitting: Podiatry

## 2019-06-22 ENCOUNTER — Other Ambulatory Visit: Payer: Self-pay | Admitting: Podiatry

## 2019-06-22 VITALS — BP 139/65 | HR 75 | Resp 16

## 2019-06-22 DIAGNOSIS — G5600 Carpal tunnel syndrome, unspecified upper limb: Secondary | ICD-10-CM | POA: Insufficient documentation

## 2019-06-22 DIAGNOSIS — M722 Plantar fascial fibromatosis: Secondary | ICD-10-CM

## 2019-06-22 DIAGNOSIS — M7750 Other enthesopathy of unspecified foot: Secondary | ICD-10-CM

## 2019-06-22 MED ORDER — MELOXICAM 15 MG PO TABS: 15.0000 mg | ORAL_TABLET | Freq: Every day | ORAL | 3 refills | Status: DC

## 2019-06-22 MED ORDER — METHYLPREDNISOLONE 4 MG PO TBPK: ORAL_TABLET | ORAL | 0 refills | Status: DC

## 2019-06-22 NOTE — Patient Instructions (Signed)

## 2019-06-22 NOTE — Progress Notes (Signed)
Subjective:  Patient ID: Karen Pitts, female    DOB: 1961-08-28,  MRN: [id number] HPI Chief Complaint  Patient presents with  . Diabetes    Diabetic Exam - last a1c was 6.0 - concerned about arch pain bilateral, some heel (L>R) x months, sharp sensations, tiny callused area arch of left, tried Dr. Felicie Morn insoles, also says skin on both feet itch all the time, she doesn't like to wear socks with her sneakers, tried tinactin spray- no help, swelling in both feet and legs-wears compression hose  . New Patient (Initial Visit)    58 y.o. female presents with the above complaint.   ROS: Denies fever chills nausea vomiting muscle aches pains calf pain back pain chest pain shortness of breath.  Past Medical History:  Diagnosis Date  . Diabetes mellitus without complication (HCC)    no meds currently  . Hypertension    Past Surgical History:  Procedure Laterality Date  . BREAST EXCISIONAL BIOPSY Right 2006   EXCISIONAL - NEG  . BREAST SURGERY     right breast  . CARPAL TUNNEL RELEASE Right 02/28/2015   Procedure: CARPAL TUNNEL RELEASE;  Surgeon: Dereck Leep, MD;  Location: [facility] ORS;  Service: Orthopedics;  Laterality: Right;  . CARPAL TUNNEL RELEASE Left 04/01/2015   Procedure: CARPAL TUNNEL RELEASE;  Surgeon: Dereck Leep, MD;  Location: [facility] ORS;  Service: Orthopedics;  Laterality: Left;  . WISDOM TOOTH EXTRACTION      Current Outpatient Medications:  .  amLODipine (NORVASC) 10 MG tablet, TK 1 T PO D FOR HIGH BP, Disp: , Rfl:  .  aspirin 81 MG tablet, Take 81 mg by mouth daily., Disp: , Rfl:  .  hydrochlorothiazide (HYDRODIURIL) 25 MG tablet, Take 25 mg by mouth daily., Disp: , Rfl:  .  losartan (COZAAR) 100 MG tablet, Take 100 mg by mouth every morning., Disp: , Rfl:  .  meloxicam (MOBIC) 15 MG tablet, Take 1 tablet (15 mg total) by mouth daily., Disp: 30 tablet, Rfl: 3 .  metFORMIN (GLUCOPHAGE) 500 MG tablet, Take by mouth daily., Disp: , Rfl:  .  methocarbamol  (ROBAXIN) 500 MG tablet, Take 500 mg by mouth 2 (two) times daily., Disp: , Rfl:  .  methylPREDNISolone (MEDROL DOSEPAK) 4 MG TBPK tablet, 6 day dose pack - take as directed, Disp: 21 tablet, Rfl: 0 .  Multiple Vitamin (MULTIVITAMIN) capsule, Take 1 capsule by mouth daily., Disp: , Rfl:  .  potassium chloride (K-DUR) 10 MEQ tablet, TK 1 T PO  QD FOR LOW POTASSIUM, Disp: , Rfl:  .  pravastatin (PRAVACHOL) 40 MG tablet, TAKE 1 TABLET BY MOUTH EVERY EVENING FOR HIGH CHOLESTEROL, Disp: , Rfl:   Allergies  Allergen Reactions  . Iron Other (See Comments)    Chest pain   Review of Systems Objective:   Vitals:   06/22/19 1014  BP: 139/65  Pulse: 75  Resp: 16    General: Well developed, nourished, in no acute distress, alert and oriented x3   Dermatological: Skin is warm, dry and supple bilateral. Nails x 10 are well maintained; remaining integument appears unremarkable at this time. There are no open sores, no preulcerative lesions, no rash or signs of infection present.  Dry xerotic feet plantar aspect.  Vascular: Dorsalis Pedis artery and Posterior Tibial artery pedal pulses are 2/4 bilateral with immedate capillary fill time. Pedal hair growth present. No varicosities and no lower extremity edema present bilateral.   Neruologic: Grossly intact via light touch bilateral. Vibratory  intact via tuning fork bilateral. Protective threshold with Semmes Wienstein monofilament intact to all pedal sites bilateral. Patellar and Achilles deep tendon reflexes 2+ bilateral. No Babinski or clonus noted bilateral.   Musculoskeletal: No gross boney pedal deformities bilateral. No pain, crepitus, or limitation noted with foot and ankle range of motion bilateral. Muscular strength 5/5 in all groups tested bilateral.  Pain on palpation medial can tubercles bilateral heels.  Gait: Unassisted, Nonantalgic.    Radiographs:  Radiographs taken today demonstrate osseous immature individual soft tissue increase  in density plantar fascial cancer sites bilateral.  No other acute findings.  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  Plan: Discussed etiology pathology conservative versus surgical therapies.  Start her on a Medrol Dosepak because her A1c is 6.0.  Also started her on meloxicam 15 mg 1 p.o. daily x30 with 3 refills injected the bilateral heels today after sterile Betadine skin prep 20 mg Kenalog 5 mg Marcaine point of maximal tenderness.  Tolerated procedure well without complications.  Also placed her in plantar pressure braces bilaterally and a single night splint.  Discussed appropriate shoe gear stretching exercises ice therapy and shoe gear modifications.   Follow-up with her in 1 month we will consider treatment to the plantar aspect of bilateral foot with itching and less so this has subsided.  May need to consider gabapentin with this.    Shacarra Choe T. EdgemontHyatt, [place] DakotaDPM

## 2019-07-01 ENCOUNTER — Telehealth: Payer: Self-pay | Admitting: Podiatry

## 2019-07-01 NOTE — Telephone Encounter (Signed)
Pt stated that she was unable to get steroid until yesterday but she is not comfortable taking them being that she is a diabetic and the steroids have too many side affects. She was interested in taking Meloxicam.

## 2019-07-02 NOTE — Telephone Encounter (Signed)
I spoke with patient she stated that she is worried about taking the steroid dose pack because of her diabetes and she has no way of checking her blood sugars.  She informed me that the Meloxicam and plantar facial braces are helping with the pain and swelling.  She will continue current therapies and call if she has any worsening symptoms.

## 2019-07-02 NOTE — Telephone Encounter (Signed)
Called patient left message to call back to discuss medication

## 2019-07-20 ENCOUNTER — Ambulatory Visit
Admission: RE | Admit: 2019-07-20 | Discharge: 2019-07-20 | Disposition: A | Discharge status: Home / Self Care | Payer: BC Managed Care – PPO | Source: Ambulatory Visit | Attending: Internal Medicine | Admitting: Internal Medicine

## 2019-07-20 DIAGNOSIS — Z1231 Encounter for screening mammogram for malignant neoplasm of breast: Secondary | ICD-10-CM | POA: Diagnosis present

## 2019-08-03 ENCOUNTER — Ambulatory Visit: Payer: BC Managed Care – PPO | Admitting: Podiatry

## 2020-06-14 ENCOUNTER — Other Ambulatory Visit: Payer: Self-pay | Admitting: Internal Medicine

## 2020-06-14 DIAGNOSIS — Z1231 Encounter for screening mammogram for malignant neoplasm of breast: Secondary | ICD-10-CM

## 2020-08-25 ENCOUNTER — Other Ambulatory Visit: Payer: Self-pay

## 2020-08-25 ENCOUNTER — Ambulatory Visit
Admission: RE | Admit: 2020-08-25 | Discharge: 2020-08-25 | Disposition: A | Discharge status: Home / Self Care | Payer: BC Managed Care – PPO | Source: Ambulatory Visit | Attending: Internal Medicine | Admitting: Internal Medicine

## 2020-08-25 DIAGNOSIS — Z1231 Encounter for screening mammogram for malignant neoplasm of breast: Secondary | ICD-10-CM | POA: Insufficient documentation

## 2021-07-31 ENCOUNTER — Other Ambulatory Visit: Payer: Self-pay | Admitting: Physician Assistant

## 2021-07-31 DIAGNOSIS — Z1231 Encounter for screening mammogram for malignant neoplasm of breast: Secondary | ICD-10-CM

## 2021-08-28 ENCOUNTER — Other Ambulatory Visit: Payer: Self-pay

## 2021-08-28 ENCOUNTER — Ambulatory Visit
Admission: RE | Admit: 2021-08-28 | Discharge: 2021-08-28 | Disposition: A | Discharge status: Home / Self Care | Payer: BC Managed Care – PPO | Source: Ambulatory Visit | Attending: Physician Assistant | Admitting: Physician Assistant

## 2021-08-28 DIAGNOSIS — Z1231 Encounter for screening mammogram for malignant neoplasm of breast: Secondary | ICD-10-CM | POA: Insufficient documentation

## 2022-01-19 ENCOUNTER — Ambulatory Visit (LOCAL_COMMUNITY_HEALTH_CENTER): Payer: BC Managed Care – PPO

## 2022-01-19 DIAGNOSIS — Z719 Counseling, unspecified: Secondary | ICD-10-CM

## 2022-01-19 DIAGNOSIS — Z23 Encounter for immunization: Secondary | ICD-10-CM

## 2022-01-19 NOTE — Progress Notes (Signed)
?  Are you feeling sick today? No ? ? ?Have you ever received a dose of COVID-19 Vaccine? [employer], [place], [place], [place], Other) Yes ? ?If yes, which vaccine and how many doses?   2 doses of Pfizer ? ? ?Did you bring the vaccination record card or other documentation?  Yes ? ? ?Do you have a health condition or are undergoing treatment that makes you moderately or severely immunocompromised? This would include, but not be limited to: cancer, HIV, organ transplant, immunosuppressive therapy/high-dose corticosteroids, or moderate/severe primary immunodeficiency.  No ? ?Have you received COVID-19 vaccine before or during hematopoietic cell transplant (HCT) or CAR-T-cell therapies? No ? ?Have you ever had an allergic reaction to: (This would include a severe allergic reaction or a reaction that caused hives, swelling, or respiratory distress, including wheezing.) A component of a COVID-19 vaccine or a previous dose of COVID-19 vaccine? No ? ? ?Have you ever had an allergic reaction to another vaccine (other thanCOVID-19 vaccine) or an injectable medication? (This would include a severe allergic reaction or a reaction that caused hives, swelling, or respiratory distress, including wheezing.)   No ?  ?Do you have a history of any of the following: ? ?Myocarditis or Pericarditis No ?Thrombosis with thrombocytopenia syndrome (TTS) No ?Multisystem Inflammatory Syndrome (MIS-C or MIS-A)? No ?Immune-mediate syndrome defined by thrombosis and thrombocytopenia, such as heparin--induced thrombocytopenia (HIT)  No ?Guillain-Barr? Syndrome (GBS) No ?COVID-19 disease within the past 3 months? No ?Vaccinated with monkeypox vaccine in the last 4 weeks? No ? ?Patient received Environmental education officer for booster.  Tolerated well.  Copy of [facility] and COVID card provided. ?  ?

## 2022-05-17 ENCOUNTER — Other Ambulatory Visit: Payer: Self-pay | Admitting: Family Medicine

## 2022-05-17 ENCOUNTER — Ambulatory Visit
Admission: RE | Admit: 2022-05-17 | Discharge: 2022-05-17 | Disposition: A | Discharge status: Home / Self Care | Payer: BC Managed Care – PPO | Attending: Family Medicine | Admitting: Family Medicine

## 2022-05-17 ENCOUNTER — Ambulatory Visit
Admission: RE | Admit: 2022-05-17 | Discharge: 2022-05-17 | Disposition: A | Discharge status: Home / Self Care | Payer: BC Managed Care – PPO | Source: Ambulatory Visit | Attending: Family Medicine | Admitting: Family Medicine

## 2022-05-17 DIAGNOSIS — M25562 Pain in left knee: Secondary | ICD-10-CM

## 2022-05-17 DIAGNOSIS — M25532 Pain in left wrist: Secondary | ICD-10-CM

## 2022-07-30 ENCOUNTER — Ambulatory Visit: Payer: BC Managed Care – PPO | Admitting: Dermatology

## 2022-08-09 ENCOUNTER — Other Ambulatory Visit: Payer: Self-pay | Admitting: Family Medicine

## 2022-08-09 DIAGNOSIS — Z1231 Encounter for screening mammogram for malignant neoplasm of breast: Secondary | ICD-10-CM

## 2022-09-12 ENCOUNTER — Other Ambulatory Visit: Payer: Self-pay

## 2022-09-12 ENCOUNTER — Emergency Department: Payer: BC Managed Care – PPO

## 2022-09-12 ENCOUNTER — Emergency Department
Admission: EM | Admit: 2022-09-12 | Discharge: 2022-09-12 | Disposition: A | Discharge status: Home / Self Care | Payer: BC Managed Care – PPO | Attending: Student in an Organized Health Care Education/Training Program | Admitting: Student in an Organized Health Care Education/Training Program

## 2022-09-12 DIAGNOSIS — R42 Dizziness and giddiness: Secondary | ICD-10-CM | POA: Diagnosis present

## 2022-09-12 DIAGNOSIS — R519 Headache, unspecified: Secondary | ICD-10-CM | POA: Insufficient documentation

## 2022-09-12 DIAGNOSIS — R55 Syncope and collapse: Secondary | ICD-10-CM | POA: Insufficient documentation

## 2022-09-12 DIAGNOSIS — R0789 Other chest pain: Secondary | ICD-10-CM | POA: Diagnosis not present

## 2022-09-12 LAB — CBC
HCT: 39.4 % (ref 36.0–46.0)
Hemoglobin: 13.4 g/dL (ref 12.0–15.0)
MCH: 29.2 pg (ref 26.0–34.0)
MCHC: 34 g/dL (ref 30.0–36.0)
MCV: 85.8 fL (ref 80.0–100.0)
Platelets: 206 10*3/uL (ref 150–400)
RBC: 4.59 MIL/uL (ref 3.87–5.11)
RDW: 14.1 % (ref 11.5–15.5)
WBC: 10.1 10*3/uL (ref 4.0–10.5)
nRBC: 0 % (ref 0.0–0.2)

## 2022-09-12 LAB — BASIC METABOLIC PANEL
Anion gap: 11 (ref 5–15)
BUN: 17 mg/dL (ref 6–20)
CO2: 26 mmol/L (ref 22–32)
Calcium: 9.8 mg/dL (ref 8.9–10.3)
Chloride: 102 mmol/L (ref 98–111)
Creatinine, Ser: 0.74 mg/dL (ref 0.44–1.00)
GFR, Estimated: >60 mL/min (ref >60)
Glucose, Bld: 115 mg/dL — ABNORMAL HIGH (ref 70–99)
Potassium: 3.7 mmol/L (ref 3.5–5.1)
Sodium: 139 mmol/L (ref 135–145)

## 2022-09-12 LAB — TROPONIN I (HIGH SENSITIVITY)
Troponin I (High Sensitivity): 7 ng/L (ref <18)
Troponin I (High Sensitivity): 8 ng/L (ref <18)

## 2022-09-12 LAB — D-DIMER, QUANTITATIVE: D-Dimer, Quant: <0.27 ug/mL-FEU (ref 0.00–0.50)

## 2022-09-12 NOTE — ED Provider Notes (Signed)
[facility] Provider Note    Event Date/Time   First MD Initiated Contact with Patient 09/12/22 1454     (approximate)   History   Shortness of Breath, Chest Pain, and Near Syncope   HPI  Karen Pitts is a 61 y.o. female to ER for evaluation of episode of dizziness and lightheadedness but also felt like the room was spinning around while she was at work.  Felt like she was going lose her balance.  Does have a history of vertigo.  The symptoms today were associated with left-sided chest discomfort going into her arm and some shortness of breath that improved after she used an albuterol inhaler.  She denies any fevers denies any numbness or tingling does not feel dizzy right now.  Had small headache yesterday.  No diaphoresis.     Physical Exam   Triage Vital Signs: ED Triage Vitals  Enc Vitals Group     BP 09/12/22 1308 (!) 147/65     Pulse Rate 09/12/22 1308 92     Resp 09/12/22 1308 16     Temp 09/12/22 1308 98 F (36.7 C)     Temp Source 09/12/22 1308 Oral     SpO2 09/12/22 1308 96 %     Weight 09/12/22 1254 203 lb (92.1 kg)     Height 09/12/22 1254 5\' 5"  (1.651 m)     Head Circumference --      Peak Flow --      Pain Score 09/12/22 1254 7     Pain Loc --      Pain Edu? --      Excl. in [place]? --     Most recent vital signs: Vitals:   09/12/22 1603 09/12/22 1604  BP: (!) 154/75   Pulse:  91  Resp:  19  Temp:    SpO2:  95%     Constitutional: Alert  Eyes: Conjunctivae are normal.  Head: Atraumatic. Nose: No congestion/rhinnorhea. Mouth/Throat: Mucous membranes are moist.   Neck: Painless ROM.  Cardiovascular:   Good peripheral circulation. Respiratory: Normal respiratory effort.  No retractions.  Gastrointestinal: Soft and nontender.  Musculoskeletal:  no deformity Neurologic:  CN- intact.  No facial droop, Normal FNF.  Normal heel to shin.  Sensation intact bilaterally. Normal speech and language. No gross focal neurologic  deficits are appreciated. No gait instability.  Skin:  Skin is warm, dry and intact. No rash noted. Psychiatric: Mood and affect are normal. Speech and behavior are normal.    ED Results / Procedures / Treatments   Labs (all labs ordered are listed, but only abnormal results are displayed) Labs Reviewed  BASIC METABOLIC PANEL - Abnormal; Notable for the following components:      Result Value   Glucose, Bld 115 (*)    All other components within normal limits  CBC  D-DIMER, QUANTITATIVE  POC URINE PREG, ED  TROPONIN I (HIGH SENSITIVITY)  TROPONIN I (HIGH SENSITIVITY)     EKG  ED ECG REPORT I, 09/14/22, the attending physician, personally viewed and interpreted this ECG.   Date: 09/12/2022  EKG Time: 12:53  Rate: 95  Rhythm: sinus  Axis: normal  Intervals:normal  ST&T Change: no stemi, no depressions    RADIOLOGY Please see ED Course for my review and interpretation.  I personally reviewed all radiographic images ordered to evaluate for the above acute complaints and reviewed radiology reports and findings.  These findings were personally discussed with the patient.  Please  see medical record for radiology report.    PROCEDURES:  Critical Care performed:   Procedures   MEDICATIONS ORDERED IN ED: Medications - No data to display   IMPRESSION / MDM / [place] / ED COURSE  I reviewed the triage vital signs and the nursing notes.                              Differential diagnosis includes, but is not limited to, bronchtiis, acs, pe, dissection, cva, mass, sah, iph, vertigo  Patient presenting to the ER for evaluation of symptoms as described above.  Based on symptoms, risk factors and considered above differential, this presenting complaint could reflect a potentially life-threatening illness therefore the patient will be placed on continuous pulse oximetry and telemetry for monitoring.  Laboratory evaluation will be sent to evaluate for  the above complaints.  She is clinically well appearing.  Exam non focal.  Low suspicion for dissection or ACS.  Doubt infectious process.  Doubt dysrhythmia.  H/O    Clinical Course as of 09/12/22 1643  Wed Sep 12, 2022  1551 CT head on my review and interpretation without evidence of sdh or iph. [PR]  [id number] Patient's imaging is reassuring repeat troponin negative.  D-dimer negative.  This not consistent with PE or dissection.  Her exam is reassuring she is pain-free at this time.  Hospitalist was considered but at this point believe she is stable and appropriate for further workup as an outpatient.  Patient agreeable to plan. [PR]    Clinical Course User Index [PR] Merlyn Lot, MD      FINAL CLINICAL IMPRESSION(S) / ED DIAGNOSES   Final diagnoses:  Atypical chest pain  Near syncope  Dizziness     Rx / DC Orders   ED Discharge Orders     None        Note:  This document was prepared using Dragon voice recognition software and may include unintentional dictation errors.    Merlyn Lot, MD 09/12/22 [phone]

## 2022-09-12 NOTE — ED Notes (Addendum)
Pt became dizzy, super sweaty, SOB, tightness and pressure on L side of chest, pain and numbness down L arm. States symptoms came sporadically and not all at once. Pt reports currently still uncomfortable in chest but much better than it was initially. Pt reports diabetes; states is on metformin; denies any previous issues with hypoglycemia. Pt's resp reg/unlabored, skin dry and laying calmly on stretcher.

## 2022-09-12 NOTE — ED Notes (Signed)
Pt states pain much better than upon arrival.

## 2022-09-12 NOTE — ED Triage Notes (Signed)
Pt to ED from walk in clinic, has had 2 episodes of near syncope since 2.5 hours ago when was at work. Denies losing consciousness but was caught by nurse at walk in clinic. States has been feeling dizzy, SOB and has pain to L chest, all since about 2.5 hours ago. Pt has pain to L arm and tightness and whole arm feels tight and heavy.   Takes 81mg  aspirin daily, hx HTN.  EKG being performed.

## 2022-09-17 ENCOUNTER — Ambulatory Visit
Admission: RE | Admit: 2022-09-17 | Discharge: 2022-09-17 | Disposition: A | Discharge status: Home / Self Care | Payer: BC Managed Care – PPO | Source: Ambulatory Visit | Attending: Family Medicine | Admitting: Family Medicine

## 2022-09-17 DIAGNOSIS — Z1231 Encounter for screening mammogram for malignant neoplasm of breast: Secondary | ICD-10-CM | POA: Insufficient documentation

## 2022-11-22 ENCOUNTER — Other Ambulatory Visit: Payer: Self-pay | Admitting: Student

## 2022-11-22 DIAGNOSIS — H93A9 Pulsatile tinnitus, unspecified ear: Secondary | ICD-10-CM

## 2022-12-13 ENCOUNTER — Ambulatory Visit
Admission: RE | Admit: 2022-12-13 | Discharge: 2022-12-13 | Disposition: A | Discharge status: Home / Self Care | Payer: BC Managed Care – PPO | Source: Ambulatory Visit | Attending: Student | Admitting: Student

## 2022-12-13 DIAGNOSIS — H93A9 Pulsatile tinnitus, unspecified ear: Secondary | ICD-10-CM

## 2022-12-13 MED ORDER — GADOPICLENOL 0.5 MMOL/ML IV SOLN
9.0000 mL | Freq: Once | INTRAVENOUS | Status: AC | PRN
  Administered 2022-12-13: 9 mL via INTRAVENOUS

## 2023-01-14 ENCOUNTER — Emergency Department: Payer: BC Managed Care – PPO

## 2023-01-14 ENCOUNTER — Emergency Department
Admission: EM | Admit: 2023-01-14 | Discharge: 2023-01-14 | Disposition: A | Discharge status: Home / Self Care | Payer: BC Managed Care – PPO | Attending: Emergency Medicine | Admitting: Emergency Medicine

## 2023-01-14 ENCOUNTER — Other Ambulatory Visit: Payer: Self-pay

## 2023-01-14 DIAGNOSIS — S161XXA Strain of muscle, fascia and tendon at neck level, initial encounter: Secondary | ICD-10-CM | POA: Insufficient documentation

## 2023-01-14 DIAGNOSIS — S29012A Strain of muscle and tendon of back wall of thorax, initial encounter: Secondary | ICD-10-CM | POA: Insufficient documentation

## 2023-01-14 DIAGNOSIS — W010XXA Fall on same level from slipping, tripping and stumbling without subsequent striking against object, initial encounter: Secondary | ICD-10-CM | POA: Diagnosis not present

## 2023-01-14 DIAGNOSIS — I10 Essential (primary) hypertension: Secondary | ICD-10-CM | POA: Diagnosis not present

## 2023-01-14 DIAGNOSIS — W19XXXA Unspecified fall, initial encounter: Secondary | ICD-10-CM

## 2023-01-14 DIAGNOSIS — S0990XA Unspecified injury of head, initial encounter: Secondary | ICD-10-CM | POA: Diagnosis not present

## 2023-01-14 DIAGNOSIS — Y92010 Kitchen of single-family (private) house as the place of occurrence of the external cause: Secondary | ICD-10-CM | POA: Diagnosis not present

## 2023-01-14 DIAGNOSIS — S199XXA Unspecified injury of neck, initial encounter: Secondary | ICD-10-CM | POA: Diagnosis present

## 2023-01-14 DIAGNOSIS — E119 Type 2 diabetes mellitus without complications: Secondary | ICD-10-CM | POA: Insufficient documentation

## 2023-01-14 DIAGNOSIS — S29019A Strain of muscle and tendon of unspecified wall of thorax, initial encounter: Secondary | ICD-10-CM

## 2023-01-14 MED ORDER — MELOXICAM 15 MG PO TABS: 15.0000 mg | ORAL_TABLET | Freq: Every day | ORAL | 2 refills | Status: AC

## 2023-01-14 MED ORDER — BACLOFEN 10 MG PO TABS: 10.0000 mg | ORAL_TABLET | Freq: Three times a day (TID) | ORAL | 0 refills | Status: AC

## 2023-01-14 NOTE — Discharge Instructions (Signed)
Follow-up with your regular doctor if not improving to 3 days.  Return emergency department worsening.  Take your medications as prescribed

## 2023-01-14 NOTE — ED Triage Notes (Signed)
Pt to ED for fall in kitchen 3 weeks ago, tripped over rug. Reports hitting head, denies LOC or blood thinner use.  States has had some intermittent headaches  Also reports hit mid back and has had some back pain. Ambulatory to triage

## 2023-01-14 NOTE — ED Triage Notes (Signed)
First Nurse: Pt here from [facility] with a head injury that happened 2 weeks ago. Pt fell in the kitchen and hit her head on the counter top. Pt denies any injury but now states back pain since that fall. Pt also c/o intermittent headache.

## 2023-01-14 NOTE — ED Provider Notes (Signed)
[facility] Provider Note    Event Date/Time   First MD Initiated Contact with Patient 01/14/23 1008     (approximate)   History   Fall   HPI  Karen Pitts is a 62 y.o. female with history of hypertension and diabetes presents emergency department complaining of a fall in her kitchen 2 weeks ago.  Patient states she tripped over a rug and fell hitting her head and injuring her neck.  Also some lower back pain associated with it.  Has continued to have headaches since the fall.  Was seen at [facility] clinic and sent here for imaging.      Physical Exam   Triage Vital Signs: ED Triage Vitals [01/14/23 0930]  Enc Vitals Group     BP (!) 126/57     Pulse Rate 89     Resp 16     Temp 98.7 F (37.1 C)     Temp src      SpO2 99 %     Weight 190 lb (86.2 kg)     Height 5\' 5"  (1.651 m)     Head Circumference      Peak Flow      Pain Score 5     Pain Loc      Pain Edu?      Excl. in [place]?     Most recent vital signs: Vitals:   01/14/23 0930  BP: (!) 126/57  Pulse: 89  Resp: 16  Temp: 98.7 F (37.1 C)  SpO2: 99%     General: Awake, no distress.   CV:  Good peripheral perfusion. regular rate and  rhythm Resp:  Normal effort. Lungs cta Abd:  No distention.   Other:  Crown of skull is tender, C-spine is tender, lower thoracic spine tender to palpation   ED Results / Procedures / Treatments   Labs (all labs ordered are listed, but only abnormal results are displayed) Labs Reviewed - No data to display   EKG     RADIOLOGY CT of the head and cervical spine, x-ray thoracic spine    PROCEDURES:   Procedures   MEDICATIONS ORDERED IN ED: Medications - No data to display   IMPRESSION / MDM / [place] / ED COURSE  I reviewed the triage vital signs and the nursing notes.                              Differential diagnosis includes, but is not limited to, contusion, fracture, strain, subdural,  SAH  Patient's presentation is most consistent with acute presentation with potential threat to life or bodily function.   CT of the head and cervical spine, x-ray thoracic spine  CT of the head and cervical spine independently reviewed and interpreted by me as being negative for any acute abnormality.  X-ray of the thoracic spine was independently reviewed and interpreted by me as being negative for any acute abnormality  I did explain findings to the patient.  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  CT of her head does not show anything concerning.  She is to follow-up with her regular doctor if not improving on 1 week.  Return if worsening.  She can also see orthopedics.  Patient was discharged stable condition.  She is in agreement with treatment plan.  She was given a prescription for meloxicam and baclofen to help with her symptoms  FINAL CLINICAL IMPRESSION(S) / ED DIAGNOSES   Final diagnoses:  Fall, initial encounter  Minor head injury, initial encounter  Strain of neck muscle, initial encounter  Thoracic myofascial strain, initial encounter     Rx / DC Orders   ED Discharge Orders          Ordered    meloxicam (MOBIC) 15 MG tablet  Daily        01/14/23 1114    baclofen (LIORESAL) 10 MG tablet  3 times daily        01/14/23 1114             Note:  This document was prepared using Dragon voice recognition software and may include unintentional dictation errors.    Versie Starks, PA-C 01/14/23 1116    Delman Kitten, MD 01/14/23 [phone]

## 2023-01-16 ENCOUNTER — Other Ambulatory Visit: Payer: Self-pay

## 2023-01-16 DIAGNOSIS — Z122 Encounter for screening for malignant neoplasm of respiratory organs: Secondary | ICD-10-CM

## 2023-01-16 DIAGNOSIS — Z87891 Personal history of nicotine dependence: Secondary | ICD-10-CM

## 2023-02-13 ENCOUNTER — Encounter: Payer: Self-pay | Admitting: Acute Care

## 2023-02-13 ENCOUNTER — Ambulatory Visit (INDEPENDENT_AMBULATORY_CARE_PROVIDER_SITE_OTHER): Payer: BC Managed Care – PPO | Admitting: Acute Care

## 2023-02-13 DIAGNOSIS — Z87891 Personal history of nicotine dependence: Secondary | ICD-10-CM | POA: Diagnosis not present

## 2023-02-13 NOTE — Patient Instructions (Signed)
Thank you for participating in the [place] [facility] Scre[facility]. It was our pleasure to meet you today. We will call you with the results of your scan within the next few days. Your scan will be assigned a Lung RADS category score by the physicians reading the scans.  This Lung RADS score determines follow up scanning.  See below for description of categories, and follow up screening recommendations. We will be in touch to schedule your follow up screening annually or based on recommendations of our providers. We will fax a copy of your scan results to your Primary Care Physician, or the physician who referred you to the program, to ensure they have the results. Please call the office if you have any questions or concerns regarding your scanning experience or results.  Our office number is [phone]. Please speak with Denise Phelps, RN. , or  Denise Buckner RN, They are  our Lung Cancer Screening RN.'s If They are unavailable when you call, Please leave a message on the voice mail. We will return your call at our earliest convenience.This voice mail is monitored several times a day.  Remember, if your scan is normal, we will scan you annually as long as you continue to meet the criteria for the program. (Age 50-80, Current smoker or smoker who has quit within the last 15 years). If you are a smoker, remember, quitting is the single most powerful action that you can take to decrease your risk of lung cancer and other pulmonary, breathing related problems. We know quitting is hard, and we are here to help.  Please let us know if there is anything we can do to help you meet your goal of quitting. If you are a former smoker, congratulations. We are proud of you! Remain smoke free! Remember you can refer friends or family members through the number above.  We will screen them to make sure they meet criteria for the program. Thank you for helping us take better care of you by  participating in Lung Screening.  You can receive free nicotine replacement therapy ( patches, gum or mints) by calling 1-800-QUIT NOW. Please call so we can get you on the path to becoming  a non-smoker. I know it is hard, but you can do this!  Lung RADS Categories:  Lung RADS 1: no nodules or definitely non-concerning nodules.  Recommendation is for a repeat annual scan in 12 months.  Lung RADS 2:  nodules that are non-concerning in appearance and behavior with a very low likelihood of becoming an active cancer. Recommendation is for a repeat annual scan in 12 months.  Lung RADS 3: nodules that are probably non-concerning , includes nodules with a low likelihood of becoming an active cancer.  Recommendation is for a 6-month repeat screening scan. Often noted after an upper respiratory illness. We will be in touch to make sure you have no questions, and to schedule your 6-month scan.  Lung RADS 4 A: nodules with concerning findings, recommendation is most often for a follow up scan in 3 months or additional testing based on our provider's assessment of the scan. We will be in touch to make sure you have no questions and to schedule the recommended 3 month follow up scan.  Lung RADS 4 B:  indicates findings that are concerning. We will be in touch with you to schedule additional diagnostic testing based on our provider's  assessment of the scan.  Other options for assistance in smoking cessation (   As covered by your insurance benefits)  Hypnosis for smoking cessation  [employer] [phone]  Acupuncture for smoking cessation  [facility] [phone]   

## 2023-02-13 NOTE — Progress Notes (Signed)
Virtual Visit via Telephone Note  I connected with Karen Pitts on 02/13/23 at 10:00 AM EDT by telephone and verified that I am speaking with the correct person using two identifiers.  Location: Patient:  At home Provider: [address] [place], [place], [address]    I discussed the limitations, risks, security and privacy concerns of performing an evaluation and management service by telephone and the availability of in person appointments. I also discussed with the patient that there may be a patient responsible charge related to this service. The patient expressed understanding and agreed to proceed.    Shared Decision Making Visit Lung Cancer Screening Program [phone])   Eligibility: Age 62 y.o. Pack Years Smoking History Calculation 21 pack year smoking history (# packs/per year x # years smoked) Recent History of coughing up blood  no Unexplained weight loss? no ( >Than 15 pounds within the last 6 months ) Prior History Lung / other cancer no (Diagnosis within the last 5 years already requiring surveillance chest CT Scans). Smoking Status Former Smoker Former Smokers: Years since quit: 7 years  Quit Date: 2017  Visit Components: Discussion included one or more decision making aids. NA Discussion included risk/benefits of screening. yes Discussion included potential follow up diagnostic testing for abnormal scans. yes Discussion included meaning and risk of over diagnosis. yes Discussion included meaning and risk of False Positives. yes Discussion included meaning of total radiation exposure. yes  Counseling Included: Importance of adherence to annual lung cancer LDCT screening. yes Impact of comorbidities on ability to participate in the program. yes Ability and willingness to under diagnostic treatment. yes  Smoking Cessation Counseling: Current Smokers:  Discussed importance of smoking cessation. yes Information about tobacco cessation classes and  interventions provided to patient. yes Patient provided with "ticket" for LDCT Scan. yes Symptomatic Patient. no  Counseling NA Diagnosis Code: Tobacco Use Z72.0 Asymptomatic Patient yes  Counseling (Intermediate counseling: > three minutes counseling) [id number] Former Smokers:  Discussed the importance of maintaining cigarette abstinence. yes Diagnosis Code: Personal History of Nicotine Dependence. W29.562 Information about tobacco cessation classes and interventions provided to patient. Yes Patient provided with "ticket" for LDCT Scan. yes Written Order for Lung Cancer Screening with LDCT placed in Epic. Yes (CT Chest Lung Cancer Screening Low Dose W/O CM) [id number] Z12.2-Screening of respiratory organs Z87.891-Personal history of nicotine dependence  I spent 25 minutes of face to face time/virtual visit time  with  Karen Pitts discussing the risks and benefits of lung cancer screening. We took the time to pause the power point at intervals to allow for questions to be asked and answered to ensure understanding. We discussed that she had taken the single most powerful action possible to decrease her risk of developing lung cancer when she quit smoking. I counseled her to remain smoke free, and to contact me if she ever had the desire to smoke again so that I can provide resources and tools to help support the effort to remain smoke free. We discussed the time and location of the scan, and that either  [name] RN, [name], RN or I  or I will call / send a letter with the results within  24-72 hours of receiving them. She has the office contact information in the event she needs to speak with me,  she verbalized understanding of all of the above and had no further questions upon leaving the office.     I explained to the patient that there has been  a high incidence of coronary artery disease noted on these exams. I explained that this is a non-gated exam therefore degree or severity  cannot be determined. This patient is on statin therapy. I have asked the patient to follow-up with their PCP regarding any incidental finding of coronary artery disease and management with diet or medication as they feel is clinically indicated. The patient verbalized understanding of the above and had no further questions.     Bevelyn Ngo, NP 02/13/2023

## 2023-02-22 ENCOUNTER — Ambulatory Visit
Admission: RE | Admit: 2023-02-22 | Discharge: 2023-02-22 | Disposition: A | Discharge status: Home / Self Care | Payer: BC Managed Care – PPO | Source: Ambulatory Visit | Attending: Family Medicine | Admitting: Family Medicine

## 2023-02-22 DIAGNOSIS — Z87891 Personal history of nicotine dependence: Secondary | ICD-10-CM | POA: Insufficient documentation

## 2023-02-22 DIAGNOSIS — Z122 Encounter for screening for malignant neoplasm of respiratory organs: Secondary | ICD-10-CM | POA: Insufficient documentation

## 2023-02-27 ENCOUNTER — Other Ambulatory Visit: Payer: Self-pay

## 2023-02-27 DIAGNOSIS — Z122 Encounter for screening for malignant neoplasm of respiratory organs: Secondary | ICD-10-CM

## 2023-02-27 DIAGNOSIS — Z87891 Personal history of nicotine dependence: Secondary | ICD-10-CM

## 2023-08-08 ENCOUNTER — Other Ambulatory Visit: Payer: Self-pay | Admitting: Family Medicine

## 2023-08-08 DIAGNOSIS — Z1231 Encounter for screening mammogram for malignant neoplasm of breast: Secondary | ICD-10-CM

## 2023-09-23 ENCOUNTER — Ambulatory Visit
Admission: RE | Admit: 2023-09-23 | Discharge: 2023-09-23 | Disposition: A | Discharge status: Home / Self Care | Payer: BC Managed Care – PPO | Source: Ambulatory Visit | Attending: Family Medicine | Admitting: Family Medicine

## 2023-09-23 DIAGNOSIS — Z1231 Encounter for screening mammogram for malignant neoplasm of breast: Secondary | ICD-10-CM | POA: Insufficient documentation

## 2024-01-20 ENCOUNTER — Other Ambulatory Visit: Payer: Self-pay | Admitting: Acute Care

## 2024-01-20 ENCOUNTER — Telehealth: Payer: Self-pay

## 2024-01-20 DIAGNOSIS — Z122 Encounter for screening for malignant neoplasm of respiratory organs: Secondary | ICD-10-CM

## 2024-01-20 DIAGNOSIS — Z87891 Personal history of nicotine dependence: Secondary | ICD-10-CM

## 2024-01-20 NOTE — Telephone Encounter (Signed)
 Copied from CRM [id number]. Topic: Appointments - Scheduling Inquiry for Clinic >> Jan 20, 2024  1:56 PM Adele Barthel wrote: Reason for CRM:   Patient is needing to schedule a CT Chest lung cancer screening. She would prefer Fridays, due to being her day off of work. She would also like to request morning appt time, between 10-11 AM if possible, so that she has transportation.   CB#   [phone]

## 2024-01-20 NOTE — Telephone Encounter (Signed)
 I left a message for the patient that since her last LCS CT was done 02/22/23 and she wanted a Friday. I have scheduled her CT on 02/28/24 @ 10:30am at [facility]

## 2024-02-28 ENCOUNTER — Ambulatory Visit
Admission: RE | Admit: 2024-02-28 | Discharge: 2024-02-28 | Disposition: A | Discharge status: Home / Self Care | Source: Ambulatory Visit | Attending: Acute Care | Admitting: Acute Care

## 2024-02-28 DIAGNOSIS — Z122 Encounter for screening for malignant neoplasm of respiratory organs: Secondary | ICD-10-CM | POA: Insufficient documentation

## 2024-02-28 DIAGNOSIS — Z87891 Personal history of nicotine dependence: Secondary | ICD-10-CM | POA: Insufficient documentation

## 2024-03-24 ENCOUNTER — Other Ambulatory Visit: Payer: Self-pay

## 2024-03-24 DIAGNOSIS — Z122 Encounter for screening for malignant neoplasm of respiratory organs: Secondary | ICD-10-CM

## 2024-03-24 DIAGNOSIS — Z87891 Personal history of nicotine dependence: Secondary | ICD-10-CM

## 2024-06-03 LAB — COLOGUARD: COLOGUARD: NEGATIVE

## 2024-08-21 ENCOUNTER — Other Ambulatory Visit: Payer: Self-pay | Admitting: Family Medicine

## 2024-08-21 DIAGNOSIS — Z1231 Encounter for screening mammogram for malignant neoplasm of breast: Secondary | ICD-10-CM

## 2024-10-02 ENCOUNTER — Encounter

## 2024-11-13 ENCOUNTER — Ambulatory Visit
Admission: RE | Admit: 2024-11-13 | Discharge: 2024-11-13 | Disposition: A | Source: Ambulatory Visit | Attending: Family Medicine | Admitting: Family Medicine

## 2024-11-13 DIAGNOSIS — Z1231 Encounter for screening mammogram for malignant neoplasm of breast: Secondary | ICD-10-CM | POA: Diagnosis present
